# Patient Record
Sex: Female | Born: 1985 | Race: White | Hispanic: No | Marital: Single | Smoking: Never smoker
Health system: Southern US, Community
[De-identification: ages and names within clinical notes are randomized; demographics above are authoritative.]

## PROBLEM LIST (undated history)

## (undated) ENCOUNTER — Inpatient Hospital Stay (HOSPITAL_COMMUNITY): Payer: Self-pay

## (undated) DIAGNOSIS — G43909 Migraine, unspecified, not intractable, without status migrainosus: Secondary | ICD-10-CM

## (undated) DIAGNOSIS — N289 Disorder of kidney and ureter, unspecified: Secondary | ICD-10-CM

## (undated) DIAGNOSIS — K219 Gastro-esophageal reflux disease without esophagitis: Secondary | ICD-10-CM

## (undated) HISTORY — PX: OTHER SURGICAL HISTORY: SHX169

## (undated) HISTORY — PX: KIDNEY SURGERY: SHX687

## (undated) HISTORY — PX: ECTOPIC PREGNANCY SURGERY: SHX613

---
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
## 2006-05-30 ENCOUNTER — Ambulatory Visit: Payer: Self-pay | Admitting: Family Medicine

## 2006-06-09 ENCOUNTER — Ambulatory Visit: Payer: Self-pay | Admitting: Family Medicine

## 2006-08-14 ENCOUNTER — Other Ambulatory Visit: Admission: RE | Admit: 2006-08-14 | Discharge: 2006-08-14 | Payer: Self-pay | Admitting: Family Medicine

## 2006-08-14 ENCOUNTER — Ambulatory Visit: Payer: Self-pay | Admitting: Family Medicine

## 2007-01-16 ENCOUNTER — Ambulatory Visit: Payer: Self-pay | Admitting: Family Medicine

## 2007-02-27 ENCOUNTER — Ambulatory Visit: Payer: Self-pay | Admitting: Family Medicine

## 2007-04-15 ENCOUNTER — Emergency Department: Payer: Self-pay | Admitting: Emergency Medicine

## 2007-06-05 ENCOUNTER — Ambulatory Visit: Payer: Self-pay | Admitting: Family Medicine

## 2007-09-24 ENCOUNTER — Ambulatory Visit: Payer: Self-pay | Admitting: Family Medicine

## 2008-07-23 ENCOUNTER — Emergency Department: Payer: Self-pay | Admitting: Emergency Medicine

## 2008-08-06 ENCOUNTER — Other Ambulatory Visit: Admission: RE | Admit: 2008-08-06 | Discharge: 2008-08-06 | Payer: Self-pay | Admitting: Family Medicine

## 2008-08-06 ENCOUNTER — Ambulatory Visit: Payer: Self-pay | Admitting: Family Medicine

## 2009-02-11 ENCOUNTER — Emergency Department: Payer: Self-pay | Admitting: Unknown Physician Specialty

## 2009-05-08 ENCOUNTER — Ambulatory Visit: Payer: Self-pay | Admitting: Family Medicine

## 2009-06-22 ENCOUNTER — Ambulatory Visit: Payer: Self-pay | Admitting: Family Medicine

## 2009-08-14 ENCOUNTER — Ambulatory Visit: Payer: Self-pay | Admitting: Family Medicine

## 2009-08-25 ENCOUNTER — Ambulatory Visit: Payer: Self-pay | Admitting: Family Medicine

## 2009-08-26 ENCOUNTER — Encounter: Admission: RE | Admit: 2009-08-26 | Discharge: 2009-08-26 | Payer: Self-pay | Admitting: Family Medicine

## 2009-09-21 ENCOUNTER — Ambulatory Visit: Payer: Self-pay | Admitting: Family Medicine

## 2009-10-08 ENCOUNTER — Emergency Department (HOSPITAL_COMMUNITY): Admission: EM | Admit: 2009-10-08 | Discharge: 2009-10-09 | Payer: Self-pay | Admitting: Emergency Medicine

## 2009-10-16 ENCOUNTER — Ambulatory Visit (HOSPITAL_COMMUNITY): Admission: RE | Admit: 2009-10-16 | Discharge: 2009-10-16 | Payer: Self-pay | Admitting: Obstetrics and Gynecology

## 2009-11-16 ENCOUNTER — Emergency Department: Payer: Self-pay | Admitting: Emergency Medicine

## 2009-12-15 ENCOUNTER — Ambulatory Visit: Payer: Self-pay | Admitting: Family Medicine

## 2010-01-14 ENCOUNTER — Ambulatory Visit: Payer: Self-pay | Admitting: Family Medicine

## 2010-12-17 ENCOUNTER — Other Ambulatory Visit: Payer: Self-pay | Admitting: Obstetrics and Gynecology

## 2010-12-31 ENCOUNTER — Inpatient Hospital Stay (HOSPITAL_COMMUNITY)
Admission: AD | Admit: 2010-12-31 | Discharge: 2011-01-01 | DRG: 775 | Disposition: A | Discharge status: Home / Self Care | Payer: Medicaid Other | Source: Ambulatory Visit | Attending: Obstetrics and Gynecology | Admitting: Obstetrics and Gynecology

## 2010-12-31 LAB — RPR: RPR Ser Ql: NONREACTIVE

## 2010-12-31 LAB — CBC
HCT: 35.5 % — ABNORMAL LOW (ref 36.0–46.0)
Hemoglobin: 11.9 g/dL — ABNORMAL LOW (ref 12.0–15.0)
MCH: 29.5 pg (ref 26.0–34.0)
RBC: 4.04 MIL/uL (ref 3.87–5.11)

## 2011-01-01 LAB — CBC
HCT: 30.8 % — ABNORMAL LOW (ref 36.0–46.0)
Hemoglobin: 10 g/dL — ABNORMAL LOW (ref 12.0–15.0)
MCHC: 32.5 g/dL (ref 30.0–36.0)
WBC: 11.4 10*3/uL — ABNORMAL HIGH (ref 4.0–10.5)

## 2011-01-11 ENCOUNTER — Inpatient Hospital Stay (HOSPITAL_COMMUNITY): Admission: AD | Admit: 2011-01-11 | Payer: Self-pay | Admitting: Obstetrics and Gynecology

## 2011-01-27 ENCOUNTER — Other Ambulatory Visit: Payer: Self-pay | Admitting: Obstetrics and Gynecology

## 2011-03-02 LAB — POCT I-STAT, CHEM 8
BUN: 11 mg/dL (ref 6–23)
Calcium, Ion: 1.14 mmol/L (ref 1.12–1.32)
Creatinine, Ser: 0.6 mg/dL (ref 0.4–1.2)
Glucose, Bld: 109 mg/dL — ABNORMAL HIGH (ref 70–99)
TCO2: 24 mmol/L (ref 0–100)

## 2011-03-02 LAB — COMPREHENSIVE METABOLIC PANEL
ALT: 15 U/L (ref 0–35)
AST: 24 U/L (ref 0–37)
CO2: 24 mEq/L (ref 19–32)
Calcium: 8.9 mg/dL (ref 8.4–10.5)
Chloride: 105 mEq/L (ref 96–112)
GFR calc Af Amer: >60 mL/min (ref >60)
GFR calc non Af Amer: >60 mL/min (ref >60)
Sodium: 137 mEq/L (ref 135–145)

## 2011-03-02 LAB — CBC
Hemoglobin: 12.9 g/dL (ref 12.0–15.0)
MCHC: 34.4 g/dL (ref 30.0–36.0)
MCHC: 34.5 g/dL (ref 30.0–36.0)
MCV: 91.7 fL (ref 78.0–100.0)
RBC: 4.1 MIL/uL (ref 3.87–5.11)
RBC: 4.08 MIL/uL (ref 3.87–5.11)
WBC: 11.3 10*3/uL — ABNORMAL HIGH (ref 4.0–10.5)

## 2011-03-02 LAB — LIPASE, BLOOD: Lipase: 20 U/L (ref 11–59)

## 2011-03-02 LAB — WET PREP, GENITAL: WBC, Wet Prep HPF POC: NONE SEEN

## 2011-03-02 LAB — DIFFERENTIAL
Eosinophils Absolute: 0.1 10*3/uL (ref 0.0–0.7)
Eosinophils Relative: 1 % (ref 0–5)
Lymphs Abs: 2 10*3/uL (ref 0.7–4.0)

## 2011-03-02 LAB — URINALYSIS, ROUTINE W REFLEX MICROSCOPIC
Glucose, UA: NEGATIVE mg/dL
Hgb urine dipstick: NEGATIVE
Ketones, ur: NEGATIVE mg/dL
Protein, ur: NEGATIVE mg/dL

## 2011-03-02 LAB — GC/CHLAMYDIA PROBE AMP, GENITAL
Chlamydia, DNA Probe: NEGATIVE
GC Probe Amp, Genital: NEGATIVE

## 2011-04-15 NOTE — H&P (Signed)
Debbie Jackson, Debbie Jackson                        ACCOUNT NO.:  [id number]   MEDICAL RECORD NO.:  [id number]                   PATIENT TYPE:  INP   LOCATION:  0373                                 FACILITY:  [facility]   PHYSICIAN:  Jamison Neighbor, M.D.               DATE OF BIRTH:  Jun 19, 1986   DATE OF ADMISSION:  12/20/2003  DATE OF DISCHARGE:                                HISTORY & PHYSICAL   ADMITTING DIAGNOSES:  1. Distal right ureteral calculus.  2. Pregnancy.  3. 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   HISTORY:  This 25 year old female developed acute right-sided pain and  presented to the emergency room at [facility].  CT scan showed a  stone in distal right ureter, and also showed that the patient was pregnant.  At first the patient stated that would be impossible noting that she had had  a period in December, and then she did admit that she was sexually active  and might indeed be pregnant.  The patient has been brought to [facility]  for definitive removal of stone and will have a double-J placed if  appropriate but great care will be given to not use fluoroscopy if at all  possible.  Patient and her parents gave informed consent for the procedure.  She will be observed afterwards and most likely will be discharged later  today.   The patient's past medical history is remarkable for a right partial  nephrectomy with subsequent re-implantation of the ureter.  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  She has no other medical illnesses.  She does take multivitamins every day as well as cranberry pills.  The  patient does not use tobacco, alcohol or street drugs.  She has an otherwise  negative medical history.   FAMILY HISTORY:  Unchanged.   SOCIAL HISTORY:  Unchanged.   REVIEW OF SYSTEMS:  Unchanged.   PHYSICAL EXAMINATION:  VITAL SIGNS:  Temperature 98.1, pulse 73,  respirations 20, blood pressure 101/53.  GENERAL:  She is a well-developed, well-nourished female in no acute  distress.  HEENT:  Normocephalic, atraumatic.  Cranial nerves II-XII are grossly  intact.  NECK:  Supple.  No adenopathy or thyromegaly.  LUNGS:  Clear.  HEART:  Regular rate and rhythm.  No murmurs, thrills, gallops, rubs, or  heaves.  ABDOMEN:  Soft and nontender with no palpable masses, rebound and guarding.  EXTREMITIES:  No cyanosis, clubbing or edema.  NEUROLOGIC:  This appears grossly intact.   IMPRESSION:  Distal right ureteral calculus in patient with past history of  right ureteral duplication, right upper pole nephrectomy, and right re-  implantation.   SECONDARY DIAGNOSIS:  Pregnancy.   PLAN:  Ureteroscopy and stone removal.                                               Jamison Neighbor, M.D.    RJE/MEDQ  D:  12/20/2003  T:  12/20/2003  Job:  [id number]

## 2011-04-15 NOTE — Op Note (Signed)
NAMEMANUEL, Debbie Jackson                        ACCOUNT NO.:  [id number]   MEDICAL RECORD NO.:  [id number]                   PATIENT TYPE:  INP   LOCATION:  0373                                 FACILITY:  [facility]   PHYSICIAN:  Jamison Neighbor, M.D.               DATE OF BIRTH:  [birth year]   DATE OF PROCEDURE:  12/20/2003  DATE OF DISCHARGE:  12/20/2003                                 OPERATIVE REPORT   REASON FOR REPEAT DICTATION:  Lost dictation.   PREOPERATIVE DIAGNOSES:  1. Distal right ureteral calculus.  2. Pregnancy.  3. Past history of right partial nephrectomy with ureteral reimplantation.   PRINCIPAL PROCEDURE:  Cystoscopy, right ureteroscopy, right double J  catheter insertion.   SURGEON:  Jamison Neighbor, M.D.   ANESTHESIA:  General.   COMPLICATIONS:  None.   DRAINS:  A 6 French x 26 cm double J catheter.   HISTORY:  This 25 year old female presented to the emergency room with right-  sided pain.  A CT scan showed a right UVJ stent but also showed the patient  to be pregnant.  The patient needs to undergo stone extraction and double J  catheter insertion.  The patient's history is complicated by the fact that  she had a partial duplication and had what sounds like a right partial  nephrectomy and reimplantation.  The patient understands the risks and  benefits of the procedure.  Her parents were apprised of the condition and  gave informed consent.   DESCRIPTION OF PROCEDURE:  After successful induction of general anesthesia,  the patient was placed in the dorsal lithotomy position and prepped with  Betadine and draped in the usual sterile fashion.  Cystoscopy was performed.  The bladder was carefully inspected, and it was free of any tumor or stones.  The left ureteral orifice was normal in configuration and location.  The  right ureteral orifice showed evidence of reimplantation.  It appeared that  the ureter from the duplicated upper pole had been removed.  The  stone was  visualized and grasped and removed.  A double J catheter was then passed and  allowed to coil in the bladder.  This was left on a string.  No fluoroscopy  was used during the procedure.  The patient tolerated the procedure well and  was taken to the recovery room in good condition.  She was sent home with  Darvocet and Keflex and was given advice as to when to remove the  __________.  She will have routine follow-up in the office.                                               Jamison Neighbor, M.D.    RJE/MEDQ  D:  01/02/2004  T:  01/03/2004  Job:  [id number] 

## 2011-04-15 NOTE — Op Note (Signed)
[name], Debbie Jackson                        ACCOUNT NO.:  [id number]   MEDICAL RECORD NO.:  [id number]                   PATIENT TYPE:  INP   LOCATION:  0373                                 FACILITY:  [facility]   PHYSICIAN:  Jamison Neighbor, M.D.               DATE OF BIRTH:  04/10/1986   DATE OF PROCEDURE:  12/20/2003  DATE OF DISCHARGE:                                 OPERATIVE REPORT   SERVICE:  Urology.   PREOPERATIVE DIAGNOSES:  1. Distal right ureteral calculus.  2. 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   POSTOPERATIVE DIAGNOSES:  1. Distal right ureteral calculus.  2. 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   PROCEDURE:  Cystoscopy, right ureteroscopy, right double J catheter  insertion.   SURGEON:  Jamison Neighbor, M.D.   COMPLICATIONS:  None.   DRAINS:  6 French x 26 cm double J catheter.   HISTORY:  This 25 year old female developed acute pain in the right hand  side yesterday evening. She presented to the emergency room where CT imaging  demonstrated a stone in the distal right ureter. The patient was also  incidentally noted to have approximately a two month pregnancy. The patient  was transferred to [facility] for definitive therapy to remove the stone  and placement of the double J catheter.  The patient understands the risks  and benefits of the procedure. She is a minor, permission was obtained from  her parents. Full informed consent was obtained.  Both the patient and  family were advised this would be done without fluoroscopy if at all  possible.   DESCRIPTION OF PROCEDURE:  After successful induction of general anesthesia,  the patient was placed dorsal lithotomy position, prepped with Betadine and  draped in the usual sterile fashion.  Cystoscopy was performed. The bladder  was carefully inspected and it was free  of any tumor or stones. On the right  hand side, the ureteral orifice was identified fairly close to the bladder  neck where it was clear that she had been reimplanted. The ureter was not  stenotic, the stone was seen actually impacted at the opening.  On the left  hand side duplication was identified. A guidewire was passed beyond the  stone and up to the kidney, fluoroscopy was not utilized to check the  position. The ureteroscope was advanced along side the wire and stone  material was identified. The nitinol basket was used to grasp the stone  material. The stone broke into small pieces and was flushed out.  The stone  almost had the appearance of struvite material. The ureteroscope was  advanced its entire length up to the kidney and no other  abnormalities in the ureter were detected. The patient had no other stones  and the opening into the  pelvis appeared quite normal. The ureteroscope was  removed, a double J catheter was passed over the guidewire and was  positioned so that the coil was just inside the bladder. This was left on a  string and will be removed in several days.                                               Jamison Neighbor, M.D.    RJE/MEDQ  D:  12/20/2003  T:  12/20/2003  Job:  [id number]

## 2011-07-16 ENCOUNTER — Observation Stay: Payer: Self-pay | Admitting: Obstetrics & Gynecology

## 2011-07-20 LAB — PATHOLOGY REPORT

## 2012-09-29 ENCOUNTER — Emergency Department: Payer: Self-pay | Admitting: Emergency Medicine

## 2012-09-29 LAB — URINALYSIS, COMPLETE
Bacteria: NONE SEEN
Bilirubin,UR: NEGATIVE
Blood: NEGATIVE
Glucose,UR: NEGATIVE mg/dL
Ketone: NEGATIVE
Leukocyte Esterase: NEGATIVE
Nitrite: NEGATIVE
Ph: 6
Protein: NEGATIVE
RBC,UR: 1 /HPF
Specific Gravity: 1.021
Squamous Epithelial: NONE SEEN
WBC UR: 1 /HPF

## 2012-09-29 LAB — PREGNANCY, URINE: Pregnancy Test, Urine: NEGATIVE m[IU]/mL

## 2012-09-30 LAB — BETA STREP CULTURE(ARMC)

## 2012-12-27 ENCOUNTER — Emergency Department (HOSPITAL_COMMUNITY)
Admission: EM | Admit: 2012-12-27 | Discharge: 2012-12-28 | Disposition: A | Discharge status: Home / Self Care | Payer: Self-pay | Attending: Emergency Medicine | Admitting: Emergency Medicine

## 2012-12-27 ENCOUNTER — Encounter (HOSPITAL_COMMUNITY): Payer: Self-pay | Admitting: *Deleted

## 2012-12-27 DIAGNOSIS — Z3202 Encounter for pregnancy test, result negative: Secondary | ICD-10-CM | POA: Insufficient documentation

## 2012-12-27 DIAGNOSIS — Z87448 Personal history of other diseases of urinary system: Secondary | ICD-10-CM | POA: Insufficient documentation

## 2012-12-27 DIAGNOSIS — F17200 Nicotine dependence, unspecified, uncomplicated: Secondary | ICD-10-CM | POA: Insufficient documentation

## 2012-12-27 DIAGNOSIS — R110 Nausea: Secondary | ICD-10-CM | POA: Insufficient documentation

## 2012-12-27 DIAGNOSIS — Z87442 Personal history of urinary calculi: Secondary | ICD-10-CM | POA: Insufficient documentation

## 2012-12-27 DIAGNOSIS — Z9889 Other specified postprocedural states: Secondary | ICD-10-CM | POA: Insufficient documentation

## 2012-12-27 DIAGNOSIS — R3915 Urgency of urination: Secondary | ICD-10-CM | POA: Insufficient documentation

## 2012-12-27 DIAGNOSIS — N1330 Unspecified hydronephrosis: Secondary | ICD-10-CM | POA: Insufficient documentation

## 2012-12-27 HISTORY — DX: Disorder of kidney and ureter, unspecified: N28.9

## 2012-12-27 LAB — URINALYSIS, ROUTINE W REFLEX MICROSCOPIC
Bilirubin Urine: NEGATIVE
Glucose, UA: NEGATIVE mg/dL
Hgb urine dipstick: NEGATIVE
Ketones, ur: NEGATIVE mg/dL
Leukocytes, UA: NEGATIVE
Nitrite: NEGATIVE
Protein, ur: NEGATIVE mg/dL
Specific Gravity, Urine: 1.025 (ref 1.005–1.030)
Urobilinogen, UA: 0.2 mg/dL (ref 0.0–1.0)
pH: 5.5 (ref 5.0–8.0)

## 2012-12-27 NOTE — ED Notes (Signed)
Pt is here for evaluation of right flank pain which she has had intermittently for some time, it has gotten worse over the past 3 months.  Pt also reports intermittent right lower abdominal pain.  Pt reports pressure, frequency and some pain with urination

## 2012-12-27 NOTE — ED Provider Notes (Signed)
History     CSN: [id number]  Arrival date & time 12/27/12  1818   First MD Initiated Contact with Patient 12/27/12 2104      Chief Complaint  Patient presents with  . Flank Pain    (Consider location/radiation/quality/duration/timing/severity/associated sxs/prior treatment) The history is provided by the patient and medical records.    Debbie Jackson is a 27 y.o. 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 He states symptoms worsened approximately 3 months ago and then again this week. She states she's had intermittent pain every day this week. She describes the pain as sharp, located in the right flank, radiating to the right abdomen and rated at a 7/10. Patient states the pain is not present during this interview.  Associated symptoms include occasional nausea, urinary urgency.  Nothing makes it better and nothing makes it worse.  Pt denies fever, chills, headache, neck pain, chest pain, shortness of breath, vomiting, diarrhea, dysuria, frequency, hematuria, syncope.  Patient also states that 3 years ago she had surgery to correct a twisted ureter and urology noted that time but this would potentially recur. Patient states that her symptoms abated for approximately 2 years and then began again approximately one year ago.  Patient also a history of kidney stones however at her last check 6 months ago did not reveal any.   Past Medical History  Diagnosis Date  . Renal disorder     since childhood    Past Surgical History  Procedure Date  . Ectopic pregnancy surgery     right tube removed after burst tubal pregnancy    No family history on file.  History  Substance Use Topics  . Smoking status: Current Every Day Smoker    Types:  Cigarettes  . Smokeless tobacco: Not on file  . Alcohol Use: No    OB History    Grav Para Term Preterm Abortions TAB SAB Ect Mult Living                  Review of Systems  Constitutional: Negative for fever, diaphoresis, appetite change, fatigue and unexpected weight change.  HENT: Negative for mouth sores, trouble swallowing, neck pain and neck stiffness.   Eyes: Negative for visual disturbance.  Respiratory: Negative for cough, chest tightness, shortness of breath, wheezing and stridor.   Cardiovascular: Negative for chest pain and palpitations.  Gastrointestinal: Positive for nausea and abdominal pain. Negative for vomiting, diarrhea, constipation, blood in stool, abdominal distention and rectal pain.  Genitourinary: Positive for urgency and flank pain. Negative for dysuria, frequency, hematuria and difficulty urinating.  Musculoskeletal: Negative for back pain.  Skin: Negative for rash.  Neurological: Negative for syncope, weakness, light-headedness and headaches.  Hematological: Negative for adenopathy. Does not bruise/bleed easily.  Psychiatric/Behavioral: Negative for confusion and sleep disturbance. The patient is not nervous/anxious.   All other systems reviewed and are negative.    Allergies  Review of patient's allergies indicates no known allergies.  Home Medications   Current Outpatient Rx  Name  Route  Sig  Dispense  Refill  . ADULT MULTIVITAMIN W/MINERALS CH   Oral   Take 1 tablet by mouth daily.         Marland Kitchen HYDROCODONE-ACETAMINOPHEN 5-325  MG PO TABS   Oral   Take 2 tablets by mouth every 4 (four) hours as needed for pain.   7 tablet   0     BP 122/74  Pulse 78  Temp 97.1 F (36.2 C) (Oral)  Resp 18  SpO2 97%  Physical Exam  Nursing note and vitals reviewed. Constitutional: She is oriented to person, place, and time. She appears well-developed and well-nourished.  HENT:  Head: Normocephalic and atraumatic.  Right Ear: Tympanic membrane,  external ear and ear canal normal.  Left Ear: Tympanic membrane, external ear and ear canal normal.  Nose: Nose normal.  Mouth/Throat: Uvula is midline, oropharynx is clear and moist and mucous membranes are normal. No oropharyngeal exudate, posterior oropharyngeal edema, posterior oropharyngeal erythema or tonsillar abscesses.  Eyes: Conjunctivae normal and EOM are normal. Pupils are equal, round, and reactive to light. No scleral icterus.  Neck: Normal range of motion.  Cardiovascular: Normal rate, regular rhythm, normal heart sounds and intact distal pulses.  Exam reveals no gallop and no friction rub.   No murmur heard. Pulmonary/Chest: Effort normal and breath sounds normal. No respiratory distress. She has no wheezes. She has no rales. She exhibits no tenderness.  Abdominal: Soft. Bowel sounds are normal. She exhibits no distension and no mass. There is no hepatosplenomegaly. There is tenderness in the right upper quadrant and right lower quadrant. There is no rigidity, no rebound, no guarding, no CVA tenderness, no tenderness at McBurney's point and negative Murphy's sign.    Musculoskeletal: Normal range of motion. She exhibits no edema and no tenderness.  Lymphadenopathy:    She has no cervical adenopathy.  Neurological: She is alert and oriented to person, place, and time. She exhibits normal muscle tone. Coordination normal.  Skin: Skin is warm and dry. No rash noted. No erythema.  Psychiatric: She has a normal mood and affect.    ED Course  Procedures (including critical care time)  Labs Reviewed  URINALYSIS, ROUTINE W REFLEX MICROSCOPIC - Abnormal; Notable for the following:    APPearance CLOUDY (*)     All other components within normal limits  POCT PREGNANCY, URINE  POCT I-STAT, CHEM 8   US Renal  12/28/2012  *RADIOLOGY REPORT*  Clinical Data: 27 year old female with flank pain.  History of chronic right hydronephrosis.  RENAL/URINARY TRACT ULTRASOUND COMPLETE   Comparison:  02/03/2012 CT and 10/09/2009 abdominal ultrasound.  Findings:  Right Kidney:  The right kidney is normal in echogenicity and measures 9.9 cm.  Severe chronic hydronephrosis is unchanged from prior studies.  There is no evidence of mass or definite renal calculi.  Left Kidney:  The left kidney is normal in echogenicity and size measuring 11.1 cm.  There is no evidence of hydronephrosis, solid renal mass or definite renal calculi.  Bladder:  The bladder is normal for degree of filling.  Bilateral ureteral jets are noted.  IMPRESSION: No evidence of acute abnormality.  Unchanged severe chronic right hydronephrosis.   Original Report Authenticated By: Harmon Pier, M.D.      1. Hydronephrosis, left   2. H/O hydronephrosis       MDM  Debbie Jackson presents with right flank pain. Patient with a history of kidney stones and twisted ureter.  Urinalysis without evidence of urinary tract infection. No hemoglobin making kidney stone unlikely. Preg test negative. Patient without pelvic pain on exam. She denies vaginal discharge or vaginal bleeding. Do not feel that a pelvic exam as indicated at this time.  Will order a renal ultrasound to evaluate for hydronephrosis.     Renal ultrasound with severe chronic hydronephrosis of the right kidney unchanged from prior studies. Chem-8 without evidence of elevated BUN creatinine. We'll discharge home with further evaluation at [facility].  Patient is nontoxic, nonseptic appearing, in no apparent distress. Pain managed.  Labs, imaging and vitals reviewed.  Patient does not meet the SIRS or Sepsis criteria.  On repeat exam patient does not have a surgical abdomin and there are nor peritoneal signs.  No concern for appendicitis, bowel obstruction, bowel perforation, cholecystitis, diverticulitis, PID or ectopic pregnancy.  Patient discharged home with symptomatic treatment and given strict instructions for follow-up urology.  I have also discussed  reasons to return immediately to the ER.  Patient expresses understanding and agrees with plan.   1. Medications: usual home medications, vicodin for severe pain 2. Treatment: rest, drink plenty of fluids, 3. Follow Up: Please followup with your primary doctor for discussion of your diagnoses and further evaluation after today's visit; if you do not have a primary care doctor use the resource guide provided to find one; followup with [facility] and her nephrologist.       Dierdre Forth, PA-C 12/28/12 0203

## 2012-12-28 ENCOUNTER — Emergency Department (HOSPITAL_COMMUNITY): Payer: Self-pay

## 2012-12-28 LAB — POCT I-STAT, CHEM 8
BUN: 11 mg/dL (ref 6–23)
Calcium, Ion: 1.22 mmol/L (ref 1.12–1.23)
Chloride: 106 mEq/L (ref 96–112)
Creatinine, Ser: 0.8 mg/dL (ref 0.50–1.10)
Glucose, Bld: 96 mg/dL (ref 70–99)
HCT: 41 % (ref 36.0–46.0)
Hemoglobin: 13.9 g/dL (ref 12.0–15.0)
Potassium: 3.7 meq/L (ref 3.5–5.1)
Sodium: 143 meq/L (ref 135–145)
TCO2: 26 mmol/L (ref 0–100)

## 2012-12-28 MED ORDER — HYDROCODONE-ACETAMINOPHEN 5-325 MG PO TABS: 2.0000 | ORAL_TABLET | ORAL | Status: DC | PRN

## 2012-12-29 NOTE — ED Provider Notes (Signed)
Medical screening examination/treatment/procedure(s) were performed by non-physician practitioner and as supervising physician I was immediately available for consultation/collaboration.   Gavin Pound. Oletta Lamas, MD 12/29/12 2206

## 2013-02-07 ENCOUNTER — Emergency Department: Payer: Self-pay | Admitting: Emergency Medicine

## 2014-10-14 ENCOUNTER — Emergency Department: Payer: Self-pay | Admitting: Emergency Medicine

## 2014-10-14 LAB — CBC
HCT: 39 % (ref 35.0–47.0)
HGB: 13.5 g/dL (ref 12.0–16.0)
MCH: 31.2 pg (ref 26.0–34.0)
MCHC: 34.5 g/dL (ref 32.0–36.0)
MCV: 90 fL (ref 80–100)
PLATELETS: 191 10*3/uL (ref 150–440)
RBC: 4.32 10*6/uL (ref 3.80–5.20)
RDW: 12.3 % (ref 11.5–14.5)
WBC: 9.7 10*3/uL (ref 3.6–11.0)

## 2014-10-14 LAB — HCG, QUANTITATIVE, PREGNANCY: BETA HCG, QUANT.: 3217 m[IU]/mL — AB

## 2014-11-28 NOTE — L&D Delivery Note (Signed)
Patient was C/C/+1 and pushed for 10 minutes with epidural.   NSVD  Female infant, Apgars 9/9, weight pending.   The patient had no laceration.  Of note, umbilical cord evulsed and manual extraction of placenta- cord appears was circumvallate - only attached to membranes and no attachment site on placenta Fundus was firm. EBL was expected amount. Placenta was delivered intact. Vagina was clear.  Baby was vigorous and doing skin to skin with mother.  Philip AspenALLAHAN, Thalia Turkington

## 2014-12-10 LAB — OB RESULTS CONSOLE RUBELLA ANTIBODY, IGM: Rubella: IMMUNE

## 2014-12-10 LAB — OB RESULTS CONSOLE HIV ANTIBODY (ROUTINE TESTING): HIV: NONREACTIVE

## 2014-12-10 LAB — OB RESULTS CONSOLE ABO/RH: RH TYPE: POSITIVE

## 2014-12-10 LAB — OB RESULTS CONSOLE ANTIBODY SCREEN: Antibody Screen: NEGATIVE

## 2014-12-10 LAB — OB RESULTS CONSOLE RPR: RPR: NONREACTIVE

## 2014-12-10 LAB — OB RESULTS CONSOLE GC/CHLAMYDIA
Chlamydia: NEGATIVE
GC PROBE AMP, GENITAL: NEGATIVE

## 2014-12-10 LAB — OB RESULTS CONSOLE HEPATITIS B SURFACE ANTIGEN: HEP B S AG: NEGATIVE

## 2015-03-27 ENCOUNTER — Encounter (HOSPITAL_COMMUNITY): Payer: Self-pay | Admitting: *Deleted

## 2015-03-27 ENCOUNTER — Inpatient Hospital Stay (HOSPITAL_COMMUNITY)
Admission: AD | Admit: 2015-03-27 | Discharge: 2015-03-27 | Disposition: A | Discharge status: Home / Self Care | Payer: Medicaid Other | Source: Ambulatory Visit | Attending: Obstetrics | Admitting: Obstetrics

## 2015-03-27 DIAGNOSIS — O4702 False labor before 37 completed weeks of gestation, second trimester: Secondary | ICD-10-CM

## 2015-03-27 DIAGNOSIS — O368120 Decreased fetal movements, second trimester, not applicable or unspecified: Secondary | ICD-10-CM | POA: Diagnosis not present

## 2015-03-27 DIAGNOSIS — Z3A27 27 weeks gestation of pregnancy: Secondary | ICD-10-CM | POA: Insufficient documentation

## 2015-03-27 DIAGNOSIS — Z87891 Personal history of nicotine dependence: Secondary | ICD-10-CM | POA: Diagnosis not present

## 2015-03-27 DIAGNOSIS — O4700 False labor before 37 completed weeks of gestation, unspecified trimester: Secondary | ICD-10-CM | POA: Insufficient documentation

## 2015-03-27 LAB — URINALYSIS, ROUTINE W REFLEX MICROSCOPIC
Bilirubin Urine: NEGATIVE
GLUCOSE, UA: NEGATIVE mg/dL
Hgb urine dipstick: NEGATIVE
KETONES UR: NEGATIVE mg/dL
NITRITE: NEGATIVE
Protein, ur: NEGATIVE mg/dL
Specific Gravity, Urine: 1.01 (ref 1.005–1.030)
UROBILINOGEN UA: 0.2 mg/dL (ref 0.0–1.0)
pH: 7 (ref 5.0–8.0)

## 2015-03-27 LAB — URINE MICROSCOPIC-ADD ON

## 2015-03-27 NOTE — MAU Note (Signed)
C/o decreased fetal movement since yesterday; c/o intermittent vaginal pressure for the past week and today the pressure is more constant; having some abdominal cramping today;

## 2015-03-27 NOTE — Discharge Instructions (Signed)

## 2015-03-27 NOTE — MAU Note (Signed)
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

## 2015-03-27 NOTE — MAU Provider Note (Signed)
History     CSN: [id number]  Arrival date and time: 03/27/15 1123   First Provider Initiated Contact with Patient 03/27/15 1211      Chief Complaint  Patient presents with  . Decreased Fetal Movement  . Vaginal Pain   HPI Debbie Jackson 29 y.o. [id number] @[redacted]w[redacted]d  presents to MAU complaining of increased pressure x several weeks but more so today and decreased fetal movement.  She noticed less fetal movement beginning yesterday but that has improved since being here.  She has felt a continuous pressure since theis morning whereas before it would come and go.  She denies VB but does report some leaking of fluid on and off for a couple of days.  She denies nausea, vomiting, fever, weakness.     OB History    Gravida Para Term Preterm AB TAB SAB Ectopic Multiple Living   6 2 2  3  3   2       Past Medical History  Diagnosis Date  . Renal disorder     since childhood    Past Surgical History  Procedure Laterality Date  . Ectopic pregnancy surgery      right tube removed after burst tubal pregnancy  . Kidney surgery      Family History  Problem Relation Age of Onset  . Alcohol abuse Neg Hx   . Arthritis Neg Hx   . Asthma Neg Hx   . Birth defects Neg Hx   . Cancer Neg Hx   . COPD Neg Hx   . Depression Neg Hx   . Diabetes Neg Hx   . Drug abuse Neg Hx   . Early death Neg Hx   . Hearing loss Neg Hx   . Heart disease Neg Hx   . Hyperlipidemia Neg Hx   . Hypertension Neg Hx   . Kidney disease Neg Hx   . Learning disabilities Neg Hx   . Mental illness Neg Hx   . Mental retardation Neg Hx   . Miscarriages / Stillbirths Neg Hx   . Stroke Neg Hx   . Vision loss Neg Hx   . Varicose Veins Neg Hx     History  Substance Use Topics  . Smoking status: Former Smoker    Types: Cigarettes  . Smokeless tobacco: Not on file  . Alcohol Use: No    Allergies: No Known Allergies  Prescriptions prior to admission  Medication Sig Dispense Refill Last Dose  . Prenatal Vit-Fe  Fumarate-FA (PRENATAL MULTIVITAMIN) TABS tablet Take 1 tablet by mouth daily at 12 noon.   03/26/2015 at Unknown time  . HYDROcodone-acetaminophen (NORCO/VICODIN) 5-325 MG per tablet Take 2 tablets by mouth every 4 (four) hours as needed for pain. (Patient not taking: Reported on 03/27/2015) 7 tablet 0 Not Taking at Unknown time    ROS Pertinent ROS in HPI.  All other systems are negative.   Physical Exam   Blood pressure 110/69, pulse 96, temperature 98.4 F (36.9 C), temperature source Oral, resp. rate 16.  Physical Exam  Constitutional: She is oriented to person, place, and time. She appears well-developed and well-nourished. No distress.  HENT:  Head: Normocephalic and atraumatic.  Eyes: EOM are normal.  Neck: Normal range of motion.  Cardiovascular: Normal rate, regular rhythm and normal heart sounds.   Respiratory: Effort normal and breath sounds normal. No respiratory distress.  GI: Soft. She exhibits no distension. There is no tenderness. There is no rebound and no guarding.  Genitourinary:  FFN collected  Speculum inserted.  No pooling noted.  Pt asked to cough forcefully and repeatedly with no leaking noted whatsoever.   Cervical check reveals closed cervix.   No need for FFN  Musculoskeletal: Normal range of motion.  Neurological: She is alert and oriented to person, place, and time.  Skin: Skin is warm and dry.  Psychiatric: She has a normal mood and affect.   Fetal Tracing: Baseline: 130 Variability: mod Accelerations: 10x10 Decelerations:none Toco:none   MAU Course  Procedures  MDM Discussed with Dr. Chestine Spore.  Pt has noted increased fetal movement while in MAU.  Fetal strip is reassuring for [redacted] week gestation.  PTL ruled out.  Dr. Chestine Spore agreeable to discharge to home with follow up next week  Assessment and Plan  A:  1. Decreased fetal movement, second trimester, not applicable or unspecified fetus   2. Threatened preterm labor, second trimester    P:  Discharge to home Fetal kick counts PTL precautions reviewed OB urine culture pending Keep OB appt for 5/3 Patient may return to MAU as needed or if her condition were to change or worsen   Bertram Denver 03/27/2015, 12:12 PM

## 2015-03-28 LAB — CULTURE, OB URINE: Colony Count: 30000

## 2015-04-30 ENCOUNTER — Inpatient Hospital Stay (HOSPITAL_COMMUNITY)
Admission: AD | Admit: 2015-04-30 | Discharge: 2015-04-30 | Disposition: A | Discharge status: Home / Self Care | Payer: Medicaid Other | Source: Ambulatory Visit | Attending: Obstetrics and Gynecology | Admitting: Obstetrics and Gynecology

## 2015-04-30 ENCOUNTER — Encounter (HOSPITAL_COMMUNITY): Payer: Self-pay | Admitting: *Deleted

## 2015-04-30 DIAGNOSIS — R102 Pelvic and perineal pain: Secondary | ICD-10-CM | POA: Diagnosis present

## 2015-04-30 DIAGNOSIS — O4703 False labor before 37 completed weeks of gestation, third trimester: Secondary | ICD-10-CM

## 2015-04-30 DIAGNOSIS — O2343 Unspecified infection of urinary tract in pregnancy, third trimester: Secondary | ICD-10-CM | POA: Insufficient documentation

## 2015-04-30 DIAGNOSIS — Z3A32 32 weeks gestation of pregnancy: Secondary | ICD-10-CM | POA: Insufficient documentation

## 2015-04-30 LAB — WET PREP, GENITAL
CLUE CELLS WET PREP: NONE SEEN
TRICH WET PREP: NONE SEEN
Yeast Wet Prep HPF POC: NONE SEEN

## 2015-04-30 LAB — URINE MICROSCOPIC-ADD ON

## 2015-04-30 LAB — URINALYSIS, ROUTINE W REFLEX MICROSCOPIC
BILIRUBIN URINE: NEGATIVE
Glucose, UA: NEGATIVE mg/dL
HGB URINE DIPSTICK: NEGATIVE
KETONES UR: NEGATIVE mg/dL
Nitrite: POSITIVE — AB
PH: 6 (ref 5.0–8.0)
Protein, ur: NEGATIVE mg/dL
Specific Gravity, Urine: 1.025 (ref 1.005–1.030)
Urobilinogen, UA: 0.2 mg/dL (ref 0.0–1.0)

## 2015-04-30 LAB — FETAL FIBRONECTIN: FETAL FIBRONECTIN: NEGATIVE

## 2015-04-30 MED ORDER — NITROFURANTOIN MONOHYD MACRO 100 MG PO CAPS: 100.0000 mg | ORAL_CAPSULE | Freq: Two times a day (BID) | ORAL | Status: DC

## 2015-04-30 NOTE — MAU Note (Signed)
Intermittent pelvis pressure, only had 2 contractions today Denies bright red vaginal bleeding.   Positive fetal movement Denies SROM/LOF  Denies any infections/complications of pregnancy

## 2015-04-30 NOTE — MAU Note (Signed)
Started yesterday, feeling pressure. Would come and go, felt like needed to push, not related to using restroom, to urinate or have bm. Has continued today, also had a few contractions, 30 min apart and then they quit

## 2015-04-30 NOTE — MAU Provider Note (Signed)
Chief Complaint:  Pelvic Pain  First Provider Initiated Contact with Patient 04/30/15 1827     HPI: [name] is a 29 y.o. [id number] at 6778w5d who presents to maternity admissions reporting pelvic pressure and feeling of needing to bear down. No problems with preterm labor this pregnancy. No history of preterm birth. Reports 0-2 contractions per hour, slightly more than usual over the last few days. Denies leakage of fluid or vaginal bleeding. Good fetal movement. Last intercourse greater than 24 hours ago.  Past Medical History: Past Medical History  Diagnosis Date  . Renal disorder     since childhood    Past obstetric history: OB History  Gravida Para Term Preterm AB SAB TAB Ectopic Multiple Living  6 2 2  3 2  0 1  2    # Outcome Date GA Lbr Len/2nd Weight Sex Delivery Anes PTL Lv  6 Current           5 SAB           4 Ectopic           3 Term           2 Term           1 SAB               Past Surgical History: Past Surgical History  Procedure Laterality Date  . Ectopic pregnancy surgery      right tube removed after burst tubal pregnancy  . Kidney surgery       Family History: Family History  Problem Relation Age of Onset  . Alcohol abuse Neg Hx   . Arthritis Neg Hx   . Asthma Neg Hx   . Birth defects Neg Hx   . Cancer Neg Hx   . COPD Neg Hx   . Depression Neg Hx   . Diabetes Neg Hx   . Drug abuse Neg Hx   . Early death Neg Hx   . Hearing loss Neg Hx   . Heart disease Neg Hx   . Hyperlipidemia Neg Hx   . Hypertension Neg Hx   . Kidney disease Neg Hx   . Learning disabilities Neg Hx   . Mental illness Neg Hx   . Mental retardation Neg Hx   . Miscarriages / Stillbirths Neg Hx   . Stroke Neg Hx   . Vision loss Neg Hx   . Varicose Veins Neg Hx     Social History: History  Substance Use Topics  . Smoking status: Never Smoker   . Smokeless tobacco: Never Used  . Alcohol Use: No    Allergies: No Known Allergies  Meds:  Prescriptions prior to  admission  Medication Sig Dispense Refill Last Dose  . Prenatal Vit-Fe Fumarate-FA (PRENATAL MULTIVITAMIN) TABS tablet Take 1 tablet by mouth daily at 12 noon.   03/26/2015 at Unknown time    ROS:  Review of Systems  Constitutional: Negative for fever and chills.  Gastrointestinal: Negative for nausea, vomiting, abdominal pain, diarrhea and constipation.  Genitourinary: Positive for frequency. Negative for dysuria, urgency, hematuria and flank pain.       Negative for vaginal bleeding, vaginal discharge or leaking of fluid.    Physical Exam  Blood pressure 120/66, pulse 80, temperature 98.3 F (36.8 C), temperature source Oral, resp. rate 16, height 5' 4.5" (1.638 m), weight 156 lb (70.761 kg). GENERAL: Well-developed, well-nourished female in no acute distress.  HEART: normal rate RESP: normal effort GI: Abd soft,  non-tender, gravid appropriate for gestational age.  MS: Extremities nontender, no edema, normal ROM NEURO: Alert and oriented x 4.  GU: NEFG, physiologic discharge, no blood. No CVAT Dilation: Fingertip Effacement (%): Thick Station: Ballotable Exam by:: Ivonne Andrew , CNM   FHT:  Baseline 145 , moderate variability, 15x15 accelerations present, no decelerations Contractions: Rare, mild   Labs: Results for orders placed or performed during the hospital encounter of 04/30/15 (from the past 24 hour(s))  Urinalysis, Routine w reflex microscopic (not at [facility])     Status: Abnormal   Collection Time: 04/30/15  5:20 PM  Result Value Ref Range   Color, Urine YELLOW YELLOW   APPearance HAZY (A) CLEAR   Specific Gravity, Urine 1.025 1.005 - 1.030   pH 6.0 5.0 - 8.0   Glucose, UA NEGATIVE NEGATIVE mg/dL   Hgb urine dipstick NEGATIVE NEGATIVE   Bilirubin Urine NEGATIVE NEGATIVE   Ketones, ur NEGATIVE NEGATIVE mg/dL   Protein, ur NEGATIVE NEGATIVE mg/dL   Urobilinogen, UA 0.2 0.0 - 1.0 mg/dL   Nitrite POSITIVE (A) NEGATIVE   Leukocytes, UA LARGE (A) NEGATIVE  Urine  microscopic-add on     Status: Abnormal   Collection Time: 04/30/15  5:20 PM  Result Value Ref Range   Squamous Epithelial / LPF FEW (A) RARE   WBC, UA 21-50 <3 WBC/hpf   Bacteria, UA MANY (A) RARE  Wet prep, genital     Status: Abnormal   Collection Time: 04/30/15  6:24 PM  Result Value Ref Range   Yeast Wet Prep HPF POC NONE SEEN NONE SEEN   Trich, Wet Prep NONE SEEN NONE SEEN   Clue Cells Wet Prep HPF POC NONE SEEN NONE SEEN   WBC, Wet Prep HPF POC FEW (A) NONE SEEN  Fetal fibronectin     Status: None   Collection Time: 04/30/15  6:24 PM  Result Value Ref Range   Fetal Fibronectin NEGATIVE NEGATIVE    Imaging:  No results found.  MAU Course: UA, pelvic exam, EFM/toco, wet prep, fetal fibronectin, push fluids.  Assessment: 1. UTI in pregnancy, third trimester   2. Preterm contractions, third trimester     Plan: Discharge home in stable condition.  Preterm labor precautions and fetal kick counts. Urine culture pending. Increase fluids and rest     Follow-up Information    Follow up with Levi Aland, MD.   Specialty:  Obstetrics and Gynecology   Why:  Routine prenatal visit or sooner as needed if symptoms worsen   Contact information:   [address] [place] [address] [phone]       Follow up with [facility] [place] [facility].   Why:  As needed in emergencies   Contact information:   [address] [id number] mc [place] [address] [phone]        Medication List    TAKE these medications        nitrofurantoin (macrocrystal-monohydrate) 100 MG capsule  Commonly known as:  MACROBID  Take 1 capsule (100 mg total) by mouth 2 (two) times daily.     prenatal multivitamin Tabs tablet  Take 1 tablet by mouth daily at 12 noon.        [place], [place] 04/30/2015 7:15 PM

## 2015-04-30 NOTE — Discharge Instructions (Signed)
Pregnancy and Urinary Tract Infection °A urinary tract infection (UTI) is a bacterial infection of the urinary tract. Infection of the urinary tract can include the ureters, kidneys (pyelonephritis), bladder (cystitis), and urethra (urethritis). All pregnant women should be screened for bacteria in the urinary tract. Identifying and treating a UTI will decrease the risk of preterm labor and developing more serious infections in both the mother and baby. °CAUSES °Bacteria germs cause almost all UTIs.  °RISK FACTORS °Many factors can increase your chances of getting a UTI during pregnancy. These include: °· Having a short urethra. °· Poor toilet and hygiene habits. °· Sexual intercourse. °· Blockage of urine along the urinary tract. °· Problems with the pelvic muscles or nerves. °· Diabetes. °· Obesity. °· Bladder problems after having several children. °· Previous history of UTI. °SIGNS AND SYMPTOMS  °· Pain, burning, or a stinging feeling when urinating. °· Suddenly feeling the need to urinate right away (urgency). °· Loss of bladder control (urinary incontinence). °· Frequent urination, more than is common with pregnancy. °· Lower abdominal or back discomfort. °· Cloudy urine. °· Blood in the urine (hematuria). °· Fever.  °When the kidneys are infected, the symptoms may be: °· Back pain. °· Flank pain on the right side more so than the left. °· Fever. °· Chills. °· Nausea. °· Vomiting. °DIAGNOSIS  °A urinary tract infection is usually diagnosed through urine tests. Additional tests and procedures are sometimes done. These may include: °· Ultrasound exam of the kidneys, ureters, bladder, and urethra. °· Looking in the bladder with a lighted tube (cystoscopy). °TREATMENT °Typically, UTIs can be treated with antibiotic medicines.  °HOME CARE INSTRUCTIONS  °· Only take over-the-counter or prescription medicines as directed by your health care provider. If you were prescribed antibiotics, take them as directed. Finish  them even if you start to feel better. °· Drink enough fluids to keep your urine clear or pale yellow. °· Do not have sexual intercourse until the infection is gone and your health care provider says it is okay. °· Make sure you are tested for UTIs throughout your pregnancy. These infections often come back.  °Preventing a UTI in the Future °· Practice good toilet habits. Always wipe from front to back. Use the tissue only once. °· Do not hold your urine. Empty your bladder as soon as possible when the urge comes. °· Do not douche or use deodorant sprays. °· Wash with soap and warm water around the genital area and the anus. °· Empty your bladder before and after sexual intercourse. °· Wear underwear with a cotton crotch. °· Avoid caffeine and carbonated drinks. They can irritate the bladder. °· Drink cranberry juice or take cranberry pills. This may decrease the risk of getting a UTI. °· Do not drink alcohol. °· Keep all your appointments and tests as scheduled.  °SEEK MEDICAL CARE IF:  °· Your symptoms get worse. °· You are still having fevers 2 or more days after treatment begins. °· You have a rash. °· You feel that you are having problems with medicines prescribed. °· You have abnormal vaginal discharge. °SEEK IMMEDIATE MEDICAL CARE IF:  °· You have back or flank pain. °· You have chills. °· You have blood in your urine. °· You have nausea and vomiting. °· You have contractions of your uterus. °· You have a gush of fluid from the vagina. °MAKE SURE YOU: °· Understand these instructions.   °· Will watch your condition.   °· Will get help right away if you are not doing   well or get worse.  Document Released: 03/11/2011 Document Revised: 09/04/2013 Document Reviewed: 06/13/2013 ExitCare Patient Information 2015 [employer]. This information is not intended to replace advice given to you by your health care provider. Make sure you discuss any questions you have with your health care provider.  Preterm  Labor Information Preterm labor is when labor starts at less than 37 weeks of pregnancy. The normal length of a pregnancy is 39 to 41 weeks. CAUSES Often, there is no identifiable underlying cause as to why a woman goes into preterm labor. One of the most common known causes of preterm labor is infection. Infections of the uterus, cervix, vagina, amniotic sac, bladder, kidney, or even the lungs (pneumonia) can cause labor to start. Other suspected causes of preterm labor include:   Urogenital infections, such as yeast infections and bacterial vaginosis.   Uterine abnormalities (uterine shape, uterine septum, fibroids, or bleeding from the placenta).   A cervix that has been operated on (it may fail to stay closed).   Malformations in the fetus.   Multiple gestations (twins, triplets, and so on).   Breakage of the amniotic sac.  RISK FACTORS  Having a previous history of preterm labor.   Having premature rupture of membranes (PROM).   Having a placenta that covers the opening of the cervix (placenta previa).   Having a placenta that separates from the uterus (placental abruption).   Having a cervix that is too weak to hold the fetus in the uterus (incompetent cervix).   Having too much fluid in the amniotic sac (polyhydramnios).   Taking illegal drugs or smoking while pregnant.   Not gaining enough weight while pregnant.   Being younger than 18 and older than 28 years old.   Having a low socioeconomic status.   Being African American. SYMPTOMS Signs and symptoms of preterm labor include:   Menstrual-like cramps, abdominal pain, or back pain.  Uterine contractions that are regular, as frequent as six in an hour, regardless of their intensity (may be mild or painful).  Contractions that start on the top of the uterus and spread down to the lower abdomen and back.   A sense of increased pelvic pressure.   A watery or bloody mucus discharge that comes  from the vagina.  TREATMENT Depending on the length of the pregnancy and other circumstances, your health care provider may suggest bed rest. If necessary, there are medicines that can be given to stop contractions and to mature the fetal lungs. If labor happens before 34 weeks of pregnancy, a prolonged hospital stay may be recommended. Treatment depends on the condition of both you and the fetus.  WHAT SHOULD YOU DO IF YOU THINK YOU ARE IN PRETERM LABOR? Call your health care provider right away. You will need to go to the hospital to get checked immediately. HOW CAN YOU PREVENT PRETERM LABOR IN FUTURE PREGNANCIES? You should:   Stop smoking if you smoke.  Maintain healthy weight gain and avoid chemicals and drugs that are not necessary.  Be watchful for any type of infection.  Inform your health care provider if you have a known history of preterm labor. Document Released: 02/04/2004 Document Revised: 07/17/2013 Document Reviewed: 12/17/2012 ExitCare Patient Information 2015 [employer]. This information is not intended to replace advice given to you by your health care provider. Make sure you discuss any questions you have with your health care provider.  

## 2015-05-02 LAB — CULTURE, OB URINE
Colony Count: >=100000
SPECIAL REQUESTS: NORMAL

## 2015-05-05 ENCOUNTER — Encounter: Payer: Self-pay | Admitting: Advanced Practice Midwife

## 2015-05-05 DIAGNOSIS — R8271 Bacteriuria: Secondary | ICD-10-CM | POA: Insufficient documentation

## 2015-05-07 ENCOUNTER — Other Ambulatory Visit: Payer: Self-pay | Admitting: Medical

## 2015-05-07 DIAGNOSIS — B951 Streptococcus, group B, as the cause of diseases classified elsewhere: Secondary | ICD-10-CM

## 2015-05-07 DIAGNOSIS — O2343 Unspecified infection of urinary tract in pregnancy, third trimester: Principal | ICD-10-CM

## 2015-05-07 MED ORDER — AMOXICILLIN 875 MG PO TABS
875.0000 mg | ORAL_TABLET | Freq: Two times a day (BID) | ORAL | Status: DC
Start: 2015-05-07 — End: 2015-06-03

## 2015-05-07 NOTE — Progress Notes (Signed)
Called patient to inform her of need for change in antibiotics due to +GBS in urine. Patient voiced understanding.   Marny Lowenstein, PA-C 05/07/2015 2:49 PM

## 2015-06-03 ENCOUNTER — Encounter (HOSPITAL_COMMUNITY): Payer: Self-pay | Admitting: *Deleted

## 2015-06-03 ENCOUNTER — Inpatient Hospital Stay (HOSPITAL_COMMUNITY): Payer: Medicaid Other

## 2015-06-03 ENCOUNTER — Inpatient Hospital Stay (HOSPITAL_COMMUNITY)
Admission: AD | Admit: 2015-06-03 | Discharge: 2015-06-03 | Disposition: A | Discharge status: Home / Self Care | Payer: Medicaid Other | Source: Ambulatory Visit | Attending: Obstetrics | Admitting: Obstetrics

## 2015-06-03 DIAGNOSIS — B951 Streptococcus, group B, as the cause of diseases classified elsewhere: Secondary | ICD-10-CM | POA: Diagnosis not present

## 2015-06-03 DIAGNOSIS — Z3A37 37 weeks gestation of pregnancy: Secondary | ICD-10-CM | POA: Insufficient documentation

## 2015-06-03 DIAGNOSIS — O368130 Decreased fetal movements, third trimester, not applicable or unspecified: Secondary | ICD-10-CM | POA: Diagnosis present

## 2015-06-03 DIAGNOSIS — N390 Urinary tract infection, site not specified: Secondary | ICD-10-CM | POA: Diagnosis not present

## 2015-06-03 DIAGNOSIS — R8271 Bacteriuria: Secondary | ICD-10-CM

## 2015-06-03 DIAGNOSIS — O368190 Decreased fetal movements, unspecified trimester, not applicable or unspecified: Secondary | ICD-10-CM

## 2015-06-03 NOTE — MAU Provider Note (Signed)
History     CSN: [id number]  Arrival date and time: 06/03/15 1728   First Provider Initiated Contact with Patient 06/03/15 1806      Chief Complaint  Patient presents with  . Decreased Fetal Movement   HPI Pt is 3954w4d pregnant sent from office with decreased FM and non reactive tracing. Pt denies vaginal bleeding, LOF; has irregular ctx  denies UTI sx Note 06/03/2015 5:41 PM    Expand All Collapse All   Sent from office, decreased fetal movement, non-reactive tracing. No bleeding or leaking. irreg contractions. Was 3 cm in office.         Past Medical History  Diagnosis Date  . Renal disorder     since childhood    Past Surgical History  Procedure Laterality Date  . Ectopic pregnancy surgery      right tube removed after burst tubal pregnancy  . Kidney surgery      Family History  Problem Relation Age of Onset  . Alcohol abuse Neg Hx   . Arthritis Neg Hx   . Asthma Neg Hx   . Birth defects Neg Hx   . Cancer Neg Hx   . COPD Neg Hx   . Depression Neg Hx   . Diabetes Neg Hx   . Drug abuse Neg Hx   . Early death Neg Hx   . Hearing loss Neg Hx   . Heart disease Neg Hx   . Hyperlipidemia Neg Hx   . Hypertension Neg Hx   . Kidney disease Neg Hx   . Learning disabilities Neg Hx   . Mental illness Neg Hx   . Mental retardation Neg Hx   . Miscarriages / Stillbirths Neg Hx   . Stroke Neg Hx   . Vision loss Neg Hx   . Varicose Veins Neg Hx     History  Substance Use Topics  . Smoking status: Never Smoker   . Smokeless tobacco: Never Used  . Alcohol Use: No    Allergies: No Known Allergies  Prescriptions prior to admission  Medication Sig Dispense Refill Last Dose  . amoxicillin (AMOXIL) 875 MG tablet Take 1 tablet (875 mg total) by mouth 2 (two) times daily. 14 tablet 0   . Prenatal Vit-Fe Fumarate-FA (PRENATAL MULTIVITAMIN) TABS tablet Take 1 tablet by mouth daily at 12 noon.   03/26/2015 at Unknown time    Review of Systems  Constitutional:  Negative for fever and chills.  Gastrointestinal: Positive for abdominal pain. Negative for nausea, vomiting, diarrhea and constipation.  Genitourinary: Negative for dysuria and urgency.   Physical Exam   Blood pressure 125/77, pulse 79, temperature 98.3 F (36.8 C), temperature source Oral, resp. rate 18.  Physical Exam  Nursing note and vitals reviewed. Constitutional: She is oriented to person, place, and time. She appears well-developed and well-nourished.  HENT:  Head: Normocephalic.  Eyes: Conjunctivae are normal. Pupils are equal, round, and reactive to light.  Neck: Normal range of motion. Neck supple.  Cardiovascular: Normal rate.   Respiratory: Effort normal.  GI: Soft. She exhibits no distension. There is no tenderness. There is no rebound and no guarding.  Mild irreg ctx; reactive NST  Musculoskeletal: She exhibits edema.  Neurological: She is alert and oriented to person, place, and time.  Skin: Skin is warm and dry.  Psychiatric: She has a normal mood and affect.    MAU Course  Procedures BPP 8/8 preliminary results RN reported to Dr. Chestine Sporelark Assessment and Plan  Decreased FM  Reactive NST and BPP 8/8 F/u with Ob appointment  Oralee Rapaport 06/03/2015, 6:06 PM

## 2015-06-03 NOTE — MAU Note (Signed)
Sent from office, decreased fetal movement, non-reactive tracing.  No bleeding or leaking.  irreg contractions.  Was 3 cm in office.

## 2015-06-11 ENCOUNTER — Encounter (HOSPITAL_COMMUNITY): Payer: Self-pay | Admitting: Anesthesiology

## 2015-06-11 ENCOUNTER — Inpatient Hospital Stay (HOSPITAL_COMMUNITY)
Admission: AD | Admit: 2015-06-11 | Discharge: 2015-06-13 | DRG: 767 | Disposition: A | Discharge status: Home / Self Care | Payer: Medicaid Other | Source: Ambulatory Visit | Attending: Obstetrics and Gynecology | Admitting: Obstetrics and Gynecology

## 2015-06-11 ENCOUNTER — Encounter (HOSPITAL_COMMUNITY): Payer: Self-pay | Admitting: *Deleted

## 2015-06-11 DIAGNOSIS — O99824 Streptococcus B carrier state complicating childbirth: Principal | ICD-10-CM | POA: Diagnosis present

## 2015-06-11 DIAGNOSIS — N2889 Other specified disorders of kidney and ureter: Secondary | ICD-10-CM | POA: Diagnosis present

## 2015-06-11 DIAGNOSIS — O26833 Pregnancy related renal disease, third trimester: Secondary | ICD-10-CM | POA: Diagnosis present

## 2015-06-11 DIAGNOSIS — Z3A38 38 weeks gestation of pregnancy: Secondary | ICD-10-CM | POA: Diagnosis present

## 2015-06-11 DIAGNOSIS — R8271 Bacteriuria: Secondary | ICD-10-CM

## 2015-06-11 LAB — CBC
HCT: 34.7 % — ABNORMAL LOW (ref 36.0–46.0)
HEMOGLOBIN: 11.5 g/dL — AB (ref 12.0–15.0)
MCH: 29 pg (ref 26.0–34.0)
MCHC: 33.1 g/dL (ref 30.0–36.0)
MCV: 87.4 fL (ref 78.0–100.0)
Platelets: 171 10*3/uL (ref 150–400)
RBC: 3.97 MIL/uL (ref 3.87–5.11)
RDW: 14.9 % (ref 11.5–15.5)
WBC: 9.5 10*3/uL (ref 4.0–10.5)

## 2015-06-11 LAB — RPR: RPR Ser Ql: NONREACTIVE

## 2015-06-11 LAB — ABO/RH: ABO/RH(D): O POS

## 2015-06-11 LAB — TYPE AND SCREEN
ABO/RH(D): O POS
Antibody Screen: NEGATIVE

## 2015-06-11 MED ORDER — ZOLPIDEM TARTRATE 5 MG PO TABS: 5.0000 mg | ORAL_TABLET | Freq: Every evening | ORAL | Status: DC | PRN

## 2015-06-11 MED ORDER — BENZOCAINE-MENTHOL 20-0.5 % EX AERO
1.0000 "application " | INHALATION_SPRAY | CUTANEOUS | Status: DC | PRN
  Administered 2015-06-11: 1 via TOPICAL
  Filled 2015-06-11: qty 56

## 2015-06-11 MED ORDER — WITCH HAZEL-GLYCERIN EX PADS
1.0000 "application " | MEDICATED_PAD | CUTANEOUS | Status: DC | PRN
  Administered 2015-06-11: 1 via TOPICAL

## 2015-06-11 MED ORDER — SODIUM CHLORIDE 0.9 % IV SOLN
2.0000 g | Freq: Once | INTRAVENOUS | Status: AC
  Administered 2015-06-11: 2 g via INTRAVENOUS
  Filled 2015-06-11: qty 2000

## 2015-06-11 MED ORDER — OXYTOCIN BOLUS FROM INFUSION
500.0000 mL | INTRAVENOUS | Status: DC
  Administered 2015-06-11: 500 mL via INTRAVENOUS

## 2015-06-11 MED ORDER — FLEET ENEMA 7-19 GM/118ML RE ENEM: 1.0000 | ENEMA | RECTAL | Status: DC | PRN

## 2015-06-11 MED ORDER — CITRIC ACID-SODIUM CITRATE 334-500 MG/5ML PO SOLN: 30.0000 mL | ORAL | Status: DC | PRN

## 2015-06-11 MED ORDER — TETANUS-DIPHTH-ACELL PERTUSSIS 5-2.5-18.5 LF-MCG/0.5 IM SUSP: 0.5000 mL | Freq: Once | INTRAMUSCULAR | Status: DC

## 2015-06-11 MED ORDER — LIDOCAINE HCL (PF) 1 % IJ SOLN
INTRAMUSCULAR | Status: AC
  Filled 2015-06-11: qty 30

## 2015-06-11 MED ORDER — ONDANSETRON HCL 4 MG PO TABS: 4.0000 mg | ORAL_TABLET | ORAL | Status: DC | PRN

## 2015-06-11 MED ORDER — OXYCODONE-ACETAMINOPHEN 5-325 MG PO TABS: 2.0000 | ORAL_TABLET | ORAL | Status: DC | PRN

## 2015-06-11 MED ORDER — OXYTOCIN 40 UNITS IN LACTATED RINGERS INFUSION - SIMPLE MED
INTRAVENOUS | Status: AC
  Filled 2015-06-11: qty 1000

## 2015-06-11 MED ORDER — IBUPROFEN 600 MG PO TABS
600.0000 mg | ORAL_TABLET | Freq: Four times a day (QID) | ORAL | Status: DC
  Administered 2015-06-11 – 2015-06-13 (×8): 600 mg via ORAL
  Filled 2015-06-11 (×8): qty 1

## 2015-06-11 MED ORDER — LACTATED RINGERS IV SOLN
INTRAVENOUS | Status: DC
  Administered 2015-06-11: 07:00:00 via INTRAVENOUS

## 2015-06-11 MED ORDER — DIPHENHYDRAMINE HCL 25 MG PO CAPS: 25.0000 mg | ORAL_CAPSULE | Freq: Four times a day (QID) | ORAL | Status: DC | PRN

## 2015-06-11 MED ORDER — OXYCODONE-ACETAMINOPHEN 5-325 MG PO TABS: 1.0000 | ORAL_TABLET | ORAL | Status: DC | PRN

## 2015-06-11 MED ORDER — OXYTOCIN 40 UNITS IN LACTATED RINGERS INFUSION - SIMPLE MED: 62.5000 mL/h | INTRAVENOUS | Status: DC

## 2015-06-11 MED ORDER — SIMETHICONE 80 MG PO CHEW: 80.0000 mg | CHEWABLE_TABLET | ORAL | Status: DC | PRN

## 2015-06-11 MED ORDER — ACETAMINOPHEN 325 MG PO TABS: 650.0000 mg | ORAL_TABLET | ORAL | Status: DC | PRN

## 2015-06-11 MED ORDER — PRENATAL MULTIVITAMIN CH
1.0000 | ORAL_TABLET | Freq: Every day | ORAL | Status: DC
  Administered 2015-06-11 – 2015-06-12 (×2): 1 via ORAL
  Filled 2015-06-11 (×2): qty 1

## 2015-06-11 MED ORDER — LACTATED RINGERS IV SOLN: 500.0000 mL | INTRAVENOUS | Status: DC | PRN

## 2015-06-11 MED ORDER — SENNOSIDES-DOCUSATE SODIUM 8.6-50 MG PO TABS
2.0000 | ORAL_TABLET | ORAL | Status: DC
  Administered 2015-06-11 – 2015-06-12 (×2): 2 via ORAL
  Filled 2015-06-11 (×2): qty 2

## 2015-06-11 MED ORDER — DIBUCAINE 1 % RE OINT: 1.0000 "application " | TOPICAL_OINTMENT | RECTAL | Status: DC | PRN

## 2015-06-11 MED ORDER — LIDOCAINE HCL (PF) 1 % IJ SOLN
30.0000 mL | INTRAMUSCULAR | Status: DC | PRN
  Filled 2015-06-11: qty 30

## 2015-06-11 MED ORDER — OXYCODONE-ACETAMINOPHEN 5-325 MG PO TABS
2.0000 | ORAL_TABLET | ORAL | Status: DC | PRN
Start: 2015-06-11 — End: 2015-06-11

## 2015-06-11 MED ORDER — ONDANSETRON HCL 4 MG/2ML IJ SOLN: 4.0000 mg | Freq: Four times a day (QID) | INTRAMUSCULAR | Status: DC | PRN

## 2015-06-11 MED ORDER — ONDANSETRON HCL 4 MG/2ML IJ SOLN: 4.0000 mg | INTRAMUSCULAR | Status: DC | PRN

## 2015-06-11 MED ORDER — LANOLIN HYDROUS EX OINT: TOPICAL_OINTMENT | CUTANEOUS | Status: DC | PRN

## 2015-06-11 NOTE — Progress Notes (Signed)
UR chart review completed.  

## 2015-06-11 NOTE — H&P (Signed)
29 y.o. 7686w5d  [id number] comes in c/o labor.  Otherwise has good fetal movement and no bleeding.  Past Medical History  Diagnosis Date  . Renal disorder     since childhood    Past Surgical History  Procedure Laterality Date  . Ectopic pregnancy surgery      right tube removed after burst tubal pregnancy  . Kidney surgery      OB History  Gravida Para Term Preterm AB SAB TAB Ectopic Multiple Living  6 2 2  3 2  0 1  2    # Outcome Date GA Lbr Len/2nd Weight Sex Delivery Anes PTL Lv  6 Current           5 SAB           4 Ectopic           3 Term           2 Term           1 SAB               History   Social History  . Marital Status: Single    Spouse Name: N/A  . Number of Children: N/A  . Years of Education: N/A   Occupational History  . Not on file.   Social History Main Topics  . Smoking status: Never Smoker   . Smokeless tobacco: Never Used  . Alcohol Use: No  . Drug Use: No  . Sexual Activity: Yes    Birth Control/ Protection: None   Other Topics Concern  . Not on file   Social History Narrative   Review of patient's allergies indicates no known allergies.    Prenatal Transfer Tool  Maternal Diabetes: No Genetic Screening: Normal Maternal Ultrasounds/Referrals: Normal Fetal Ultrasounds or other Referrals:  None Maternal Substance Abuse:  No Significant Maternal Medications:  None Significant Maternal Lab Results: Lab values include: Group B Strep positive  Other PNC: uncomplicated.    Filed Vitals:   06/11/15 0734  BP: 115/65  Pulse: 74  Temp:   Resp: 18     Lungs/Cor:  NAD Abdomen:  soft, gravid Ex:  no cords, erythema SVE:  5cm in mau FHTs:  130, good STV, NST R Toco:  2-3   A/P   Admitted to L&D  Quick progression to complete  Was able to administer 1 dose of ampicillin prior to delivery  GBS Pos  Routine care  [place], Luther ParodySIDNEY

## 2015-06-11 NOTE — Lactation Note (Signed)
This note was copied from the chart of Debbie [name]. Lactation Consultation Note; Mom reports that baby had already fed twice, nursed well with no pain. States it is going much better that she thought it would. With her last baby she fed formula while she was in the hospital but when she got home and her milk came in she tried breast feeding. Baby would not latch- it did not go well. Reviewed feeding cues and encouraged to feed whenever she sees them. Bf brochure given with resources for support after DC. No questions at present. To call for assist prn  Patient Name: Debbie [name] Today's Date: 06/11/2015 Reason for consult: Initial assessment   Maternal Data Formula Feeding for Exclusion: Yes Reason for exclusion: Mother's choice to formula and breast feed on admission Does the patient have breastfeeding experience prior to this delivery?: No (attempted with last baby once she got home and her milk came in, baby would not latch well)  Feeding    LATCH Score/Interventions                      Lactation Tools Discussed/Used     Consult Status Consult Status: Follow-up Date: 06/12/15 Follow-up type: In-patient    Pamelia HoitWeeks, Quintez Maselli D 06/11/2015, 1:30 PM

## 2015-06-12 LAB — CBC
HCT: 32.1 % — ABNORMAL LOW (ref 36.0–46.0)
Hemoglobin: 10.5 g/dL — ABNORMAL LOW (ref 12.0–15.0)
MCH: 29.2 pg (ref 26.0–34.0)
MCHC: 32.7 g/dL (ref 30.0–36.0)
MCV: 89.4 fL (ref 78.0–100.0)
Platelets: 131 10*3/uL — ABNORMAL LOW (ref 150–400)
RBC: 3.59 MIL/uL — ABNORMAL LOW (ref 3.87–5.11)
RDW: 15.4 % (ref 11.5–15.5)
WBC: 11.3 10*3/uL — ABNORMAL HIGH (ref 4.0–10.5)

## 2015-06-12 NOTE — Progress Notes (Signed)
Post Partum Day 1 Subjective: no complaints, up ad lib, voiding, tolerating PO, + flatus and breast feeding  Objective: Blood pressure 113/72, pulse 61, temperature 98.4 F (36.9 C), temperature source Oral, resp. rate 18, height 5' 4.5" (1.638 m), weight 74.39 kg (164 lb), SpO2 98 %, unknown if currently breastfeeding.  Physical Exam:  General: alert, cooperative and no distress Lochia: appropriate Uterine Fundus: firm DVT Evaluation: No evidence of DVT seen on physical exam. Negative Homan's sign. No cords or calf tenderness.   Recent Labs  06/11/15 0620 06/12/15 0540  HGB 11.5* 10.5*  HCT 34.7* 32.1*    Assessment/Plan: Plan for discharge tomorrow, Breastfeeding and Contraception will discuss at post partum visit   LOS: 1 day   Quenesha Douglass STACIA 06/12/2015, 9:01 AM

## 2015-06-13 MED ORDER — IBUPROFEN 600 MG PO TABS: 600.0000 mg | ORAL_TABLET | Freq: Four times a day (QID) | ORAL | Status: DC | PRN

## 2015-06-13 MED ORDER — OXYCODONE-ACETAMINOPHEN 5-325 MG PO TABS: 1.0000 | ORAL_TABLET | ORAL | Status: DC | PRN

## 2015-06-13 NOTE — Lactation Note (Signed)
This note was copied from the chart of Debbie Nonnie DoneRebecca Rhude. Lactation Consultation Note  Follow up made.  Noted mom giving all formula bottles since yesterday afternoon.  I asked mom if she has changed to formula feeding and she stated she has.  Offered assist and asked if she felt she had enough assist and support and she verbalizes she did.  Mom is confident with her decision.  Reviewed breast care as milk comes to volume.  Patient Name: Debbie Jackson     Maternal Data    Feeding Feeding Type: Bottle Fed - Formula  LATCH Score/Interventions                      Lactation Tools Discussed/Used     Consult Status      Huston FoleyMOULDEN, Lyann Hagstrom S Jackson, 8:59 AM

## 2015-06-13 NOTE — Discharge Summary (Signed)
Obstetric Discharge Summary Reason for Admission: onset of labor Prenatal Procedures: none Intrapartum Procedures: spontaneous vaginal delivery Postpartum Procedures: none Complications-Operative and Postpartum: none HEMOGLOBIN  Date Value Ref Range Status  06/12/2015 10.5* 12.0 - 15.0 g/dL Final   HGB  Date Value Ref Range Status  10/14/2014 13.5 12.0-16.0 g/dL Final   HCT  Date Value Ref Range Status  06/12/2015 32.1* 36.0 - 46.0 % Final  10/14/2014 39.0 35.0-47.0 % Final    Physical Exam:  General: alert, cooperative and no distress Lochia: appropriate Uterine Fundus: firm perineum: healing well, no significant drainage, no dehiscence DVT Evaluation: No evidence of DVT seen on physical exam. Negative Homan's sign. No cords or calf tenderness.  Discharge Diagnoses: Term Pregnancy-delivered  Discharge Information: Date: 06/13/2015 Activity: pelvic rest Diet: routine Medications: PNV, Ibuprofen and Percocet Condition: stable Instructions: refer to practice specific booklet Discharge to: home   Newborn Data: Live born female  Birth Weight: 7 lb 12.3 oz (3525 g) APGAR: 9, 9  Home with mother.  Debbie HartINN, Debbie Jackson Debbie Jackson 06/13/2015, 9:23 AM

## 2015-07-17 ENCOUNTER — Other Ambulatory Visit: Payer: Self-pay

## 2015-07-20 LAB — CYTOLOGY - PAP

## 2016-01-12 DIAGNOSIS — Q6211 Congenital occlusion of ureteropelvic junction: Secondary | ICD-10-CM | POA: Insufficient documentation

## 2016-01-12 DIAGNOSIS — Q6210 Congenital occlusion of ureter, unspecified: Secondary | ICD-10-CM | POA: Insufficient documentation

## 2016-03-17 ENCOUNTER — Emergency Department: Payer: Medicaid Other

## 2016-03-17 ENCOUNTER — Encounter: Payer: Self-pay | Admitting: Emergency Medicine

## 2016-03-17 ENCOUNTER — Emergency Department
Admission: EM | Admit: 2016-03-17 | Discharge: 2016-03-17 | Disposition: A | Discharge status: Home / Self Care | Payer: Medicaid Other | Attending: Emergency Medicine | Admitting: Emergency Medicine

## 2016-03-17 DIAGNOSIS — R109 Unspecified abdominal pain: Secondary | ICD-10-CM

## 2016-03-17 DIAGNOSIS — Z791 Long term (current) use of non-steroidal anti-inflammatories (NSAID): Secondary | ICD-10-CM | POA: Insufficient documentation

## 2016-03-17 DIAGNOSIS — R1011 Right upper quadrant pain: Secondary | ICD-10-CM | POA: Diagnosis present

## 2016-03-17 DIAGNOSIS — N390 Urinary tract infection, site not specified: Secondary | ICD-10-CM | POA: Diagnosis not present

## 2016-03-17 LAB — COMPREHENSIVE METABOLIC PANEL
ALBUMIN: 4.3 g/dL (ref 3.5–5.0)
ALT: 25 U/L (ref 14–54)
AST: 20 U/L (ref 15–41)
Alkaline Phosphatase: 65 U/L (ref 38–126)
Anion gap: 6 (ref 5–15)
BUN: 7 mg/dL (ref 6–20)
CHLORIDE: 109 mmol/L (ref 101–111)
CO2: 25 mmol/L (ref 22–32)
CREATININE: 0.73 mg/dL (ref 0.44–1.00)
Calcium: 8.9 mg/dL (ref 8.9–10.3)
GFR calc Af Amer: >60 mL/min (ref >60)
GFR calc non Af Amer: >60 mL/min (ref >60)
GLUCOSE: 97 mg/dL (ref 65–99)
POTASSIUM: 4 mmol/L (ref 3.5–5.1)
Sodium: 140 mmol/L (ref 135–145)
Total Bilirubin: 0.7 mg/dL (ref 0.3–1.2)
Total Protein: 7.2 g/dL (ref 6.5–8.1)

## 2016-03-17 LAB — URINALYSIS COMPLETE WITH MICROSCOPIC (ARMC ONLY)
Bilirubin Urine: NEGATIVE
GLUCOSE, UA: NEGATIVE mg/dL
Hgb urine dipstick: NEGATIVE
Ketones, ur: NEGATIVE mg/dL
Nitrite: NEGATIVE
PH: 5 (ref 5.0–8.0)
PROTEIN: NEGATIVE mg/dL
SPECIFIC GRAVITY, URINE: 1.014 (ref 1.005–1.030)

## 2016-03-17 LAB — CBC
HEMATOCRIT: 40.3 % (ref 35.0–47.0)
Hemoglobin: 13.9 g/dL (ref 12.0–16.0)
MCH: 30.1 pg (ref 26.0–34.0)
MCHC: 34.5 g/dL (ref 32.0–36.0)
MCV: 87.3 fL (ref 80.0–100.0)
PLATELETS: 204 10*3/uL (ref 150–440)
RBC: 4.62 MIL/uL (ref 3.80–5.20)
RDW: 12.7 % (ref 11.5–14.5)
WBC: 7.4 10*3/uL (ref 3.6–11.0)

## 2016-03-17 LAB — LIPASE, BLOOD: LIPASE: 21 U/L (ref 11–51)

## 2016-03-17 LAB — PREGNANCY, URINE: PREG TEST UR: NEGATIVE

## 2016-03-17 MED ORDER — ONDANSETRON 4 MG PO TBDP: 4.0000 mg | ORAL_TABLET | Freq: Four times a day (QID) | ORAL | Status: DC | PRN

## 2016-03-17 MED ORDER — CEPHALEXIN 500 MG PO CAPS: 500.0000 mg | ORAL_CAPSULE | Freq: Four times a day (QID) | ORAL | Status: DC

## 2016-03-17 MED ORDER — CEPHALEXIN 500 MG PO CAPS
500.0000 mg | ORAL_CAPSULE | Freq: Once | ORAL | Status: AC
  Administered 2016-03-17: 500 mg via ORAL
  Filled 2016-03-17: qty 1

## 2016-03-17 NOTE — Discharge Instructions (Signed)
You have been seen in the Emergency Department (ED) today for pain when urinating.  Your workup today suggests that you have a urinary tract infection (UTI).  Please take your antibiotic as prescribed and over-the-counter pain medication (Tylenol or Motrin) as needed, but no more than recommended on the label instructions.  Drink PLENTY of fluids.  Call your regular doctor to schedule the next available appointment to follow up on todays ED visit, or return immediately to the ED if your pain worsens, you have decreased urine production, develop fever, persistent vomiting, or other symptoms that concern you.    Urinary Tract Infection Urinary tract infections (UTIs) can develop anywhere along your urinary tract. Your urinary tract is your body's drainage system for removing wastes and extra water. Your urinary tract includes two kidneys, two ureters, a bladder, and a urethra. Your kidneys are a pair of bean-shaped organs. Each kidney is about the size of your fist. They are located below your ribs, one on each side of your spine. CAUSES Infections are caused by microbes, which are microscopic organisms, including fungi, viruses, and bacteria. These organisms are so small that they can only be seen through a microscope. Bacteria are the microbes that most commonly cause UTIs. SYMPTOMS  Symptoms of UTIs may vary by age and gender of the patient and by the location of the infection. Symptoms in young women typically include a frequent and intense urge to urinate and a painful, burning feeling in the bladder or urethra during urination. Older women and men are more likely to be tired, shaky, and weak and have muscle aches and abdominal pain. A fever may mean the infection is in your kidneys. Other symptoms of a kidney infection include pain in your back or sides below the ribs, nausea, and vomiting. DIAGNOSIS To diagnose a UTI, your caregiver will ask you about your symptoms. Your caregiver will also ask  you to provide a urine sample. The urine sample will be tested for bacteria and white blood cells. White blood cells are made by your body to help fight infection. TREATMENT  Typically, UTIs can be treated with medication. Because most UTIs are caused by a bacterial infection, they usually can be treated with the use of antibiotics. The choice of antibiotic and length of treatment depend on your symptoms and the type of bacteria causing your infection. HOME CARE INSTRUCTIONS  If you were prescribed antibiotics, take them exactly as your caregiver instructs you. Finish the medication even if you feel better after you have only taken some of the medication.  Drink enough water and fluids to keep your urine clear or pale yellow.  Avoid caffeine, tea, and carbonated beverages. They tend to irritate your bladder.  Empty your bladder often. Avoid holding urine for long periods of time.  Empty your bladder before and after sexual intercourse.  After a bowel movement, women should cleanse from front to back. Use each tissue only once. SEEK MEDICAL CARE IF:   You have back pain.  You develop a fever.  Your symptoms do not begin to resolve within 3 days. SEEK IMMEDIATE MEDICAL CARE IF:   You have severe back pain or lower abdominal pain.  You develop chills.  You have nausea or vomiting.  You have continued burning or discomfort with urination. MAKE SURE YOU:   Understand these instructions.  Will watch your condition.  Will get help right away if you are not doing well or get worse.   This information is not intended  to replace advice given to you by your health care provider. Make sure you discuss any questions you have with your health care provider.   Document Released: 08/24/2005 Document Revised: 08/05/2015 Document Reviewed: 12/23/2011 Elsevier Interactive Patient Education 2016 Elsevier Inc.   Abdominal Pain, Adult Many things can cause abdominal pain. Usually, abdominal  pain is not caused by a disease and will improve without treatment. It can often be observed and treated at home. Your health care provider will do a physical exam and possibly order blood tests and X-rays to help determine the seriousness of your pain. However, in many cases, more time must pass before a clear cause of the pain can be found. Before that point, your health care provider may not know if you need more testing or further treatment. HOME CARE INSTRUCTIONS Monitor your abdominal pain for any changes. The following actions may help to alleviate any discomfort you are experiencing:  Only take over-the-counter or prescription medicines as directed by your health care provider.  Do not take laxatives unless directed to do so by your health care provider.  Try a clear liquid diet (broth, tea, or water) as directed by your health care provider. Slowly move to a bland diet as tolerated. SEEK MEDICAL CARE IF:  You have unexplained abdominal pain.  You have abdominal pain associated with nausea or diarrhea.  You have pain when you urinate or have a bowel movement.  You experience abdominal pain that wakes you in the night.  You have abdominal pain that is worsened or improved by eating food.  You have abdominal pain that is worsened with eating fatty foods.  You have a fever. SEEK IMMEDIATE MEDICAL CARE IF:  Your pain does not go away within 2 hours.  You keep throwing up (vomiting).  Your pain is felt only in portions of the abdomen, such as the right side or the left lower portion of the abdomen.  You pass bloody or black tarry stools. MAKE SURE YOU:  Understand these instructions.  Will watch your condition.  Will get help right away if you are not doing well or get worse.   This information is not intended to replace advice given to you by your health care provider. Make sure you discuss any questions you have with your health care provider.   Document Released:  08/24/2005 Document Revised: 08/05/2015 Document Reviewed: 07/24/2013 Elsevier Interactive Patient Education [employer] Elsevier Inc.

## 2016-03-17 NOTE — ED Notes (Signed)
Pt presents with RUQ pain, n/v/d x 2 weeks. Pt has seen PCP but has not been able to have ultrasound due to medicaid red tape. Pt alert & oriented with NAD. Pain intermittent.

## 2016-03-17 NOTE — ED Provider Notes (Signed)
[facility] Emergency Department Provider Note  ____________________________________________  Time seen: Approximately 11:02 AM  I have reviewed the triage vital signs and the nursing notes.   HISTORY  Chief Complaint Abdominal Pain    HPI Debbie Jackson is a 30 y.o. female history of a duplicated renal system, previous pregnancy.  The patient reports that she has been experiencing intermittent pain in the last 10-15 minutes in the right upper abdomen associated with nausea and occasional vomiting off and on for about the last 2 weeks. Last experienced last night and caused her to vomit twice. She is presently not experiencing any pain or vomiting. She's felt well this morning. She saw her doctor and was advised to have an ultrasound possibly of her gallbladder, but since she was unable to get this arranged due to insurance concerns and has presented to the ER to have it done sooner.  No fevers or chills. No pain in her back or trouble with urination. She does have a history of kidney stones but reports this pain does not seem similar.  Denies pregnancy today. No pain in the lower abdomen or "pelvis".   Past Medical History  Diagnosis Date  . Renal disorder     since childhood  . Lupus (systemic lupus erythematosus) (HCC)     Patient Active Problem List   Diagnosis Date Noted  . Normal labor 06/11/2015  . GBS bacteriuria 05/05/2015    Past Surgical History  Procedure Laterality Date  . Ectopic pregnancy surgery      right tube removed after burst tubal pregnancy  . Kidney surgery      Current Outpatient Rx  Name  Route  Sig  Dispense  Refill  . cephALEXin (KEFLEX) 500 MG capsule   Oral   Take 1 capsule (500 mg total) by mouth 4 (four) times daily.   40 capsule   0   . ibuprofen (ADVIL,MOTRIN) 600 MG tablet   Oral   Take 1 tablet (600 mg total) by mouth every 6 (six) hours as needed.   60 tablet   3   . ondansetron (ZOFRAN ODT) 4 MG  disintegrating tablet   Oral   Take 1 tablet (4 mg total) by mouth every 6 (six) hours as needed for nausea or vomiting.   20 tablet   0   . oxyCODONE-acetaminophen (PERCOCET/ROXICET) 5-325 MG per tablet   Oral   Take 1-2 tablets by mouth every 4 (four) hours as needed for severe pain (for pain scale 4-7).   30 tablet   0   . Prenatal Vit-Fe Fumarate-FA (PRENATAL MULTIVITAMIN) TABS tablet   Oral   Take 1 tablet by mouth daily at 12 noon.           Allergies Review of patient's allergies indicates no known allergies.  Family History  Problem Relation Age of Onset  . Alcohol abuse Neg Hx   . Arthritis Neg Hx   . Asthma Neg Hx   . Birth defects Neg Hx   . Cancer Neg Hx   . COPD Neg Hx   . Depression Neg Hx   . Diabetes Neg Hx   . Drug abuse Neg Hx   . Early death Neg Hx   . Hearing loss Neg Hx   . Heart disease Neg Hx   . Hyperlipidemia Neg Hx   . Kidney disease Neg Hx   . Learning disabilities Neg Hx   . Mental illness Neg Hx   . Mental retardation Neg  Hx   . Miscarriages / Stillbirths Neg Hx   . Stroke Neg Hx   . Vision loss Neg Hx   . Varicose Veins Neg Hx   . Hypertension Mother   . Hypertension Father     Social History Social History  Substance Use Topics  . Smoking status: Never Smoker   . Smokeless tobacco: Never Used  . Alcohol Use: No    Review of Systems Constitutional: No fever/chills Eyes: No visual changes. ENT: No sore throat. Cardiovascular: Denies chest pain. Respiratory: Denies shortness of breath. Gastrointestinal:   No constipation. Genitourinary: Negative for dysuria. Musculoskeletal: Negative for back pain. Skin: Negative for rash. Neurological: Negative for headaches, focal weakness or numbness.  Patient reports she had a stool culture and blood work done at her primary care doctor's office and was told everything came back normal.  10-point ROS otherwise negative.  ____________________________________________   PHYSICAL  EXAM:  VITAL SIGNS: ED Triage Vitals  Enc Vitals Group     BP 03/17/16 1041 115/61 mmHg     Pulse Rate 03/17/16 1041 88     Resp 03/17/16 1041 18     Temp 03/17/16 1041 98.2 F (36.8 C)     Temp Source 03/17/16 1041 Oral     SpO2 03/17/16 1041 98 %     Weight 03/17/16 1041 150 lb (68.04 kg)     Height 03/17/16 1041 5\' 5"  (1.651 m)     Head Cir --      Peak Flow --      Pain Score 03/17/16 1042 4     Pain Loc --      Pain Edu? --      Excl. in GC? --    Constitutional: Alert and oriented. Well appearing and in no acute distress. Eyes: Conjunctivae are normal. PERRL. EOMI. Head: Atraumatic. Nose: No congestion/rhinnorhea. Mouth/Throat: Mucous membranes are moist.  Oropharynx non-erythematous. Neck: No stridor.   Cardiovascular: Normal rate, regular rhythm. Grossly normal heart sounds.  Good peripheral circulation. Respiratory: Normal respiratory effort.  No retractions. Lungs CTAB. Gastrointestinal: Soft and nontender except for a very slight discomfort under the right rib cage to palpation. No distention. No abdominal bruits. No CVA tenderness. No rebound or guarding. No peritonitis. No pain at McBurney's point. Some very slight discomfort with Murphy sign. Musculoskeletal: No lower extremity tenderness nor edema.  No joint effusions. Neurologic:  Normal speech and language. No gross focal neurologic deficits are appreciated. No gait instability. Skin:  Skin is warm, dry and intact. No rash noted. Psychiatric: Mood and affect are normal. Speech and behavior are normal.  ____________________________________________   LABS (all labs ordered are listed, but only abnormal results are displayed)  Labs Reviewed  URINALYSIS COMPLETEWITH MICROSCOPIC ([facility] ONLY) - Abnormal; Notable for the following:    Color, Urine YELLOW (*)    APPearance CLOUDY (*)    Leukocytes, UA 3+ (*)    Bacteria, UA FEW (*)    Squamous Epithelial / LPF 6-30 (*)    All other components within normal  limits  URINE CULTURE  CBC  COMPREHENSIVE METABOLIC PANEL  LIPASE, BLOOD  PREGNANCY, URINE   ____________________________________________  EKG  ED ECG REPORT I, Birdie Fetty, the attending physician, personally viewed and interpreted this ECG.  Date: 03/17/2016 EKG Time: 11:15 Rate: 70 Rhythm: normal sinus rhythm QRS Axis: normal Intervals: normal ST/T Wave abnormalities: normal Conduction Disturbances: none Narrative Interpretation: unremarkable, nonspecific T-wave abnormality noted in V3 probable normal variant  ____________________________________________  RADIOLOGY  US Renal (Final result) Result time: 03/17/16 12:29:39   Final result by Rad Results In Interface (03/17/16 12:29:39)   Narrative:   CLINICAL DATA: Right upper quadrant pain for 2 weeks. Diarrhea for 2 months. Vomiting for 2 weeks. Subsequent encounter.  EXAM: RENAL / URINARY TRACT ULTRASOUND COMPLETE  COMPARISON: CT abdomen and pelvis 01/05/2016 and 02/03/2012.  FINDINGS: Right Kidney:  Length: 10.9 cm. Chronic right hydronephrosis does not appear notably changed. Cause for hydronephrosis is not identified. No focal renal lesion is seen. Echogenicity is unremarkable.  Left Kidney:  Length: 13.0 cm. The patient has a duplicated left renal collecting system. No hydronephrosis or focal lesion. Echogenicity is unremarkable.  Bladder:  Appears normal for degree of bladder distention.  IMPRESSION: No change in chronic right hydronephrosis. No acute abnormality.  Duplicated left renal collecting system.   Electronically Signed By: Drusilla Kanner M.D. On: 03/17/2016 12:29          US Abdomen Limited RUQ (Final result) Result time: 03/17/16 12:27:39   Final result by Rad Results In Interface (03/17/16 12:27:39)   Narrative:   CLINICAL DATA: Right upper quadrant and abdominal pain for two weeks  EXAM: US ABDOMEN LIMITED - RIGHT UPPER QUADRANT  COMPARISON:  None.  FINDINGS: Gallbladder:  No gallstones or wall thickening visualized. No sonographic Murphy sign noted by sonographer.  Common bile duct:  Diameter: 2 mm  Liver:  No focal lesion identified. Within normal limits in parenchymal echogenicity.  IMPRESSION: Normal right upper quadrant ultrasound.   Electronically Signed By: Esperanza Heir M.D. On: 03/17/2016 12:27    ____________________________________________   PROCEDURES  Procedure(s) performed: None  Critical Care performed: No  ____________________________________________   INITIAL IMPRESSION / ASSESSMENT AND PLAN / ED COURSE  Pertinent labs & imaging results that were available during my care of the patient were reviewed by me and considered in my medical decision making (see chart for details).  Right upper quadrant pain, intermittent. Presently denies. Does have some very minimal tenderness in the right upper quadrant, no evidence of a surgical abdomen or peritonitis. Given her previous history we will obtain an ultrasound to evaluate for acute renal abnormality though she tells me she has a history of chronic hydronephrosis. She does not seem to have any acute urinary symptoms. Afebrile well-appearing in no distress.  We'll also obtain ultrasound of the right upper quadrant to evaluate for biliary etiology. No pain in the lower abdomen, no pain at McBurney's point and nothing to indicate an obvious need for CT imaging at this time.  ----------------------------------------- 1:05 PM on 03/17/2016 -----------------------------------------  Resting comfortably. No distress. Ultrasounds performed reassuring for no acute. She does not have any acute concern at this time. Urinalysis shows bacteria, leuk trase, lately she may have a mild urinary tract infection. We will treat her for this and advised on careful abdominal return precautions for agreeable.  Return precautions and treatment recommendations  and follow-up discussed with the patient who is agreeable with the plan.  ____________________________________________   FINAL CLINICAL IMPRESSION(S) / ED DIAGNOSES  Final diagnoses:  Abdominal pain  Acute urinary tract infection      Sharyn Creamer, MD 03/17/16 1606

## 2016-03-17 NOTE — ED Notes (Signed)
Patient transported to Ultrasound 

## 2016-03-17 NOTE — ED Notes (Signed)
Pt presents to ED with RUQ pain for two weeks. Pt reports diarrhea for two months. Pt reports vomiting for two weeks. Pt states went to PCP yesterday who recommended a scan but medicaid would not pay for it and pt states she did not want to wait any longer.

## 2016-03-21 LAB — URINE CULTURE
Culture: 10000 — AB
Special Requests: NORMAL

## 2016-07-07 ENCOUNTER — Emergency Department (HOSPITAL_COMMUNITY): Payer: Medicaid Other

## 2016-07-07 ENCOUNTER — Emergency Department (HOSPITAL_COMMUNITY)
Admission: EM | Admit: 2016-07-07 | Discharge: 2016-07-07 | Disposition: A | Discharge status: Home / Self Care | Payer: Medicaid Other | Attending: Physician Assistant | Admitting: Physician Assistant

## 2016-07-07 ENCOUNTER — Encounter (HOSPITAL_COMMUNITY): Payer: Self-pay | Admitting: Emergency Medicine

## 2016-07-07 DIAGNOSIS — N1330 Unspecified hydronephrosis: Secondary | ICD-10-CM | POA: Insufficient documentation

## 2016-07-07 DIAGNOSIS — R109 Unspecified abdominal pain: Secondary | ICD-10-CM | POA: Diagnosis present

## 2016-07-07 DIAGNOSIS — R0981 Nasal congestion: Secondary | ICD-10-CM | POA: Insufficient documentation

## 2016-07-07 DIAGNOSIS — M545 Low back pain: Secondary | ICD-10-CM | POA: Diagnosis not present

## 2016-07-07 LAB — COMPREHENSIVE METABOLIC PANEL
ALBUMIN: 4.1 g/dL (ref 3.5–5.0)
ALT: 20 U/L (ref 14–54)
AST: 19 U/L (ref 15–41)
Alkaline Phosphatase: 66 U/L (ref 38–126)
Anion gap: 6 (ref 5–15)
BUN: 6 mg/dL (ref 6–20)
CALCIUM: 9.3 mg/dL (ref 8.9–10.3)
CHLORIDE: 107 mmol/L (ref 101–111)
CO2: 25 mmol/L (ref 22–32)
CREATININE: 0.61 mg/dL (ref 0.44–1.00)
GFR calc Af Amer: >60 mL/min (ref >60)
GLUCOSE: 85 mg/dL (ref 65–99)
Potassium: 3.6 mmol/L (ref 3.5–5.1)
Sodium: 138 mmol/L (ref 135–145)
TOTAL PROTEIN: 6.7 g/dL (ref 6.5–8.1)
Total Bilirubin: 0.4 mg/dL (ref 0.3–1.2)

## 2016-07-07 LAB — URINALYSIS, ROUTINE W REFLEX MICROSCOPIC
BILIRUBIN URINE: NEGATIVE
Glucose, UA: NEGATIVE mg/dL
Hgb urine dipstick: NEGATIVE
KETONES UR: NEGATIVE mg/dL
Leukocytes, UA: NEGATIVE
NITRITE: NEGATIVE
PH: 5.5 (ref 5.0–8.0)
Protein, ur: NEGATIVE mg/dL
Specific Gravity, Urine: 1.02 (ref 1.005–1.030)

## 2016-07-07 LAB — CBC
HEMATOCRIT: 39.6 % (ref 36.0–46.0)
Hemoglobin: 13.7 g/dL (ref 12.0–15.0)
MCH: 30.6 pg (ref 26.0–34.0)
MCHC: 34.6 g/dL (ref 30.0–36.0)
MCV: 88.4 fL (ref 78.0–100.0)
PLATELETS: 227 10*3/uL (ref 150–400)
RBC: 4.48 MIL/uL (ref 3.87–5.11)
RDW: 12.7 % (ref 11.5–15.5)
WBC: 7.1 10*3/uL (ref 4.0–10.5)

## 2016-07-07 LAB — LIPASE, BLOOD: LIPASE: 32 U/L (ref 11–51)

## 2016-07-07 LAB — PREGNANCY, URINE: Preg Test, Ur: NEGATIVE

## 2016-07-07 MED ORDER — AZITHROMYCIN 250 MG PO TABS: ORAL_TABLET | ORAL | 0 refills | Status: DC

## 2016-07-07 NOTE — ED Triage Notes (Signed)
Pt. Stated, I've had sinus trouble for about a month and I think my kidneys are acting up , Im having back pain. I vomited 3 times last night.

## 2016-07-07 NOTE — Discharge Instructions (Signed)
Please follow-up with your physicians.

## 2016-07-07 NOTE — ED Provider Notes (Signed)
[facility]-EMERGENCY DEPT Provider Note   CSN: [id number] Arrival date & time: 07/07/16  1427  First Provider Contact:  None       History   Chief Complaint Chief Complaint  Patient presents with  . Flank Pain  . Sinus Problem  . Emesis    HPI Debbie Jackson is a 30 y.o. female.  Patient also complain of sinus pressure. Last month.   The history is provided by the patient.  Back Pain   This is a new problem. The current episode started more than 2 days ago. The problem occurs daily. The problem has not changed since onset.The pain is present in the lumbar spine. The quality of the pain is described as stabbing. The pain is at a severity of 7/10. The pain is moderate. The pain is the same all the time (Colicky pain.). Pertinent negatives include no chest pain, no fever, no abdominal pain, no tingling and no weakness. She has tried NSAIDs for the symptoms. The treatment provided no relief.     Patient is a very pleasant 30 year old female presenting with intermittent back pain.  Past Medical History:  Diagnosis Date  . Lupus (systemic lupus erythematosus) (HCC)   . Renal disorder    since childhood    Patient Active Problem List   Diagnosis Date Noted  . Normal labor 06/11/2015  . GBS bacteriuria 05/05/2015    Past Surgical History:  Procedure Laterality Date  . ECTOPIC PREGNANCY SURGERY     right tube removed after burst tubal pregnancy  . KIDNEY SURGERY      OB History    Gravida Para Term Preterm AB Living   SAB TAB Ectopic Multiple Live Births   2 0 1 0 1       Home Medications    Prior to Admission medications   Medication Sig Start Date End Date Taking? Authorizing Provider  cephALEXin (KEFLEX) 500 MG capsule Take 1 capsule (500 mg total) by mouth 4 (four) times daily. 03/17/16   Sharyn Creamer, MD  ibuprofen (ADVIL,MOTRIN) 600 MG tablet Take 1 tablet (600 mg total) by mouth every 6 (six) hours as needed. 06/13/15   Essie Hart, MD  ondansetron  (ZOFRAN ODT) 4 MG disintegrating tablet Take 1 tablet (4 mg total) by mouth every 6 (six) hours as needed for nausea or vomiting. 03/17/16   Sharyn Creamer, MD  oxyCODONE-acetaminophen (PERCOCET/ROXICET) 5-325 MG per tablet Take 1-2 tablets by mouth every 4 (four) hours as needed for severe pain (for pain scale 4-7). 06/13/15   Essie Hart, MD  Prenatal Vit-Fe Fumarate-FA (PRENATAL MULTIVITAMIN) TABS tablet Take 1 tablet by mouth daily at 12 noon.    Historical Provider, MD    Family History Family History  Problem Relation Age of Onset  . Hypertension Mother   . Hypertension Father   . Alcohol abuse Neg Hx   . Arthritis Neg Hx   . Asthma Neg Hx   . Birth defects Neg Hx   . Cancer Neg Hx   . COPD Neg Hx   . Depression Neg Hx   . Diabetes Neg Hx   . Drug abuse Neg Hx   . Early death Neg Hx   . Hearing loss Neg Hx   . Heart disease Neg Hx   . Hyperlipidemia Neg Hx   . Kidney disease Neg Hx   . Learning disabilities Neg Hx   . Mental illness Neg Hx   . Mental  retardation Neg Hx   . Miscarriages / Stillbirths Neg Hx   . Stroke Neg Hx   . Vision loss Neg Hx   . Varicose Veins Neg Hx     Social History Social History  Substance Use Topics  . Smoking status: Never Smoker  . Smokeless tobacco: Never Used  . Alcohol use No     Allergies   Review of patient's allergies indicates no known allergies.   Review of Systems Review of Systems  Constitutional: Negative for activity change and fever.  HENT: Positive for congestion and sinus pressure.   Respiratory: Negative for shortness of breath.   Cardiovascular: Negative for chest pain.  Gastrointestinal: Negative for abdominal pain.  Musculoskeletal: Positive for back pain.  Neurological: Negative for tingling and weakness.     Physical Exam Updated Vital Signs BP 120/75 (BP Location: Right Arm)   Pulse 64   Temp 97.9 F (36.6 C) (Oral)   Resp 17   Ht 5\' 6"  (1.676 m)   Wt 135 lb (61.2 kg)   LMP 06/09/2016   SpO2 100%    BMI 21.79 kg/m   Physical Exam  Constitutional: She is oriented to person, place, and time. She appears well-developed and well-nourished.  HENT:  Head: Normocephalic and atraumatic.  Sinus pressure  Eyes: Right eye exhibits no discharge.  Cardiovascular: Normal rate.   No murmur heard. Pulmonary/Chest: Effort normal.  Abdominal: There is no tenderness.  Mild CVA tenderness.  Musculoskeletal: Normal range of motion.  Neurological: She is oriented to person, place, and time. No cranial nerve deficit.  Skin: Skin is warm and dry. She is not diaphoretic.  Psychiatric: She has a normal mood and affect.  Nursing note and vitals reviewed.    ED Treatments / Results  Labs (all labs ordered are listed, but only abnormal results are displayed) Labs Reviewed  LIPASE, BLOOD  COMPREHENSIVE METABOLIC PANEL  CBC  URINALYSIS, ROUTINE W REFLEX MICROSCOPIC (NOT AT [facility])    EKG  EKG Interpretation None       Radiology No results found.  Procedures Procedures (including critical care time)  Medications Ordered in ED Medications - No data to display   Initial Impression / Assessment and Plan / ED Course  I have reviewed the triage vital signs and the nursing notes.  Pertinent labs & imaging results that were available during my care of the patient were reviewed by me and considered in my medical decision making (see chart for details).  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 She is here because of intermittent colicky pain in her right kidney. She is concerned about a kidney stone. She has history of stone. Patient also here for sinus congestion for the last month. 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 Plan to treat sinus infection over one month with Z-Pak and ENT follow-up when necessary.   8:45 PM CT negative for stones. UA does not show infection. Will treat with  Z-Pak and have follow-up as needed.   Final Clinical Impressions(s) / ED Diagnoses   Final diagnoses:  None    New Prescriptions New Prescriptions   No medications on file     Onesha Krebbs Randall AnLyn Lindi Abram, MD 07/07/16 2046

## 2016-07-08 LAB — URINE CULTURE

## 2016-10-01 ENCOUNTER — Encounter (HOSPITAL_COMMUNITY): Payer: Self-pay

## 2016-10-01 ENCOUNTER — Emergency Department (HOSPITAL_COMMUNITY): Payer: Self-pay

## 2016-10-01 ENCOUNTER — Emergency Department (HOSPITAL_COMMUNITY)
Admission: EM | Admit: 2016-10-01 | Discharge: 2016-10-02 | Disposition: A | Discharge status: Home / Self Care | Payer: Self-pay | Attending: Emergency Medicine | Admitting: Emergency Medicine

## 2016-10-01 DIAGNOSIS — Z3A08 8 weeks gestation of pregnancy: Secondary | ICD-10-CM

## 2016-10-01 DIAGNOSIS — O0281 Inappropriate change in quantitative human chorionic gonadotropin (hCG) in early pregnancy: Secondary | ICD-10-CM | POA: Insufficient documentation

## 2016-10-01 DIAGNOSIS — B9689 Other specified bacterial agents as the cause of diseases classified elsewhere: Secondary | ICD-10-CM

## 2016-10-01 DIAGNOSIS — N760 Acute vaginitis: Secondary | ICD-10-CM

## 2016-10-01 DIAGNOSIS — O23591 Infection of other part of genital tract in pregnancy, first trimester: Secondary | ICD-10-CM | POA: Insufficient documentation

## 2016-10-01 DIAGNOSIS — O4690 Antepartum hemorrhage, unspecified, unspecified trimester: Secondary | ICD-10-CM

## 2016-10-01 LAB — I-STAT BETA HCG BLOOD, ED (MC, WL, AP ONLY)

## 2016-10-01 LAB — I-STAT CHEM 8, ED
BUN: 9 mg/dL (ref 6–20)
CALCIUM ION: 1.16 mmol/L (ref 1.15–1.40)
Chloride: 104 mmol/L (ref 101–111)
Creatinine, Ser: 0.6 mg/dL (ref 0.44–1.00)
GLUCOSE: 79 mg/dL (ref 65–99)
HCT: 35 % — ABNORMAL LOW (ref 36.0–46.0)
HEMOGLOBIN: 11.9 g/dL — AB (ref 12.0–15.0)
Potassium: 3.1 mmol/L — ABNORMAL LOW (ref 3.5–5.1)
SODIUM: 139 mmol/L (ref 135–145)
TCO2: 23 mmol/L (ref 0–100)

## 2016-10-01 LAB — WET PREP, GENITAL
SPERM: NONE SEEN
TRICH WET PREP: NONE SEEN
WBC, Wet Prep HPF POC: NONE SEEN
YEAST WET PREP: NONE SEEN

## 2016-10-01 LAB — RH IG WORKUP (INCLUDES ABO/RH)
ABO/RH(D): O POS
Gestational Age(Wks): 3

## 2016-10-01 NOTE — ED Notes (Signed)
Patient taken to Ultrasound

## 2016-10-01 NOTE — ED Provider Notes (Signed)
[facility]-EMERGENCY DEPT Provider Note   CSN: [id number] Arrival date & time: 10/01/16  1929   History   Chief Complaint Chief Complaint  Patient presents with  . Abdominal Pain    HPI Debbie Jackson is a 30 y.o. female.  HPI  Patient has hx of lupus, renal disorder, GBS bacteria, ectopic tubal pregnancy requiring right tubal removal comes to the ED with spotting and abdominal cramping. She obtained a positive pregnancy test 2 weeks ago which she is excited about and came to be evaluated. She called her OB but has been unable to reach them. She is no longer having spotting or cramping at this time. She has not had fever, back pain, N/V/D, weakness, dysuria, or vaginal discharge.  Past Medical History:  Diagnosis Date  . Lupus (systemic lupus erythematosus) (HCC)   . Renal disorder    since childhood    Patient Active Problem List   Diagnosis Date Noted  . Normal labor 06/11/2015  . GBS bacteriuria 05/05/2015    Past Surgical History:  Procedure Laterality Date  . ECTOPIC PREGNANCY SURGERY     right tube removed after burst tubal pregnancy  . KIDNEY SURGERY      OB History    Gravida Para Term Preterm AB Living   6 3 3   3 1    SAB TAB Ectopic Multiple Live Births   2 0 1 0 1       Home Medications    Prior to Admission medications   Medication Sig Start Date End Date Taking? Authorizing Provider  azithromycin (ZITHROMAX Z-PAK) 250 MG tablet Take one daily for 4 days Patient not taking: Reported on 10/01/2016 07/07/16   Courteney Lyn Mackuen, MD  cephALEXin (KEFLEX) 500 MG capsule Take 1 capsule (500 mg total) by mouth 4 (four) times daily. Patient not taking: Reported on 10/01/2016 03/17/16   Sharyn Creamer, MD  ibuprofen (ADVIL,MOTRIN) 600 MG tablet Take 1 tablet (600 mg total) by mouth every 6 (six) hours as needed. Patient not taking: Reported on 10/01/2016 06/13/15   Essie Hart, MD  metroNIDAZOLE (FLAGYL) 500 MG tablet Take 1 tablet (500 mg total) by mouth 2 (two) times  daily. 10/02/16   Sanjuan Sawa Neva Seat, PA-C  ondansetron (ZOFRAN ODT) 4 MG disintegrating tablet Take 1 tablet (4 mg total) by mouth every 6 (six) hours as needed for nausea or vomiting. Patient not taking: Reported on 10/01/2016 03/17/16   Sharyn Creamer, MD  oxyCODONE-acetaminophen (PERCOCET/ROXICET) 5-325 MG per tablet Take 1-2 tablets by mouth every 4 (four) hours as needed for severe pain (for pain scale 4-7). Patient not taking: Reported on 10/01/2016 06/13/15   Essie Hart, MD    Family History Family History  Problem Relation Age of Onset  . Hypertension Mother   . Hypertension Father   . Alcohol abuse Neg Hx   . Arthritis Neg Hx   . Asthma Neg Hx   . Birth defects Neg Hx   . Cancer Neg Hx   . COPD Neg Hx   . Depression Neg Hx   . Diabetes Neg Hx   . Drug abuse Neg Hx   . Early death Neg Hx   . Hearing loss Neg Hx   . Heart disease Neg Hx   . Hyperlipidemia Neg Hx   . Kidney disease Neg Hx   . Learning disabilities Neg Hx   . Mental illness Neg Hx   . Mental retardation Neg Hx   . Miscarriages / Stillbirths Neg Hx   .  Stroke Neg Hx   . Vision loss Neg Hx   . Varicose Veins Neg Hx     Social History Social History  Substance Use Topics  . Smoking status: Never Smoker  . Smokeless tobacco: Never Used  . Alcohol use No     Allergies   Patient has no known allergies.   Review of Systems Review of Systems  Review of Systems All other systems negative except as documented in the HPI. All pertinent positives and negatives as reviewed in the HPI.  Physical Exam Updated Vital Signs BP 112/67 (BP Location: Right Arm)   Pulse 85   Temp 98.6 F (37 C) (Oral)   Resp 12   Ht 5\' 6"  (1.676 m)   Wt 62.9 kg   LMP 08/11/2016   SpO2 100%   BMI 22.36 kg/m   Physical Exam  Constitutional: She appears well-developed and well-nourished.  HENT:  Head: Normocephalic and atraumatic.  Eyes: Conjunctivae are normal. Pupils are equal, round, and reactive to light.  Neck: Trachea  normal, normal range of motion and full passive range of motion without pain. Neck supple.  Cardiovascular: Normal rate, regular rhythm and normal pulses.   Pulmonary/Chest: Effort normal and breath sounds normal. Chest wall is not dull to percussion. She exhibits no tenderness, no crepitus, no edema, no deformity and no retraction.  Abdominal: Soft. Normal appearance and bowel sounds are normal.  Genitourinary: Vagina normal and uterus normal. There is no rash or tenderness on the right labia. There is no rash or tenderness on the left labia. Cervix exhibits no motion tenderness, no discharge and no friability. Right adnexum displays no mass and no tenderness. Left adnexum displays no mass and no tenderness.  Genitourinary Comments: Cervix is closed, no blood in vaginal vault  Musculoskeletal: Normal range of motion.  Neurological: She is alert. She has normal strength.  Skin: Skin is warm, dry and intact.  Psychiatric: She has a normal mood and affect. Her speech is normal and behavior is normal. Judgment and thought content normal. Cognition and memory are normal.     ED Treatments / Results  Labs (all labs ordered are listed, but only abnormal results are displayed) Labs Reviewed  WET PREP, GENITAL - Abnormal; Notable for the following:       Result Value   Clue Cells Wet Prep HPF POC PRESENT (*)    All other components within normal limits  HCG, QUANTITATIVE, PREGNANCY - Abnormal; Notable for the following:    hCG, Beta Chain, Quant, S 79,236 (*)    All other components within normal limits  I-STAT BETA HCG BLOOD, ED ([facility], [facility], [facility] ONLY) - Abnormal; Notable for the following:    I-stat hCG, quantitative >2,000.0 (*)    All other components within normal limits  I-STAT CHEM 8, ED - Abnormal; Notable for the following:    Potassium 3.1 (*)    Hemoglobin 11.9 (*)    HCT 35.0 (*)    All other components within normal limits  RH IG WORKUP (INCLUDES ABO/RH)  GC/CHLAMYDIA PROBE AMP ([facility]) NOT AT [facility]    EKG  EKG Interpretation None       Radiology [place] Ob Comp Less 14 Wks  Result Date: 10/02/2016 CLINICAL DATA:  Vaginal bleeding. EXAM: OBSTETRIC <14 WK US AND TRANSVAGINAL OB US TECHNIQUE: Both transabdominal and transvaginal ultrasound examinations were performed for complete evaluation of the gestation as well as the maternal uterus, adnexal regions, and pelvic cul-de-sac. Transvaginal technique was performed  to assess early pregnancy. COMPARISON:  None. FINDINGS: Intrauterine gestational sac: Single Yolk sac:  Visible Embryo:  Visible Cardiac Activity: Visible Heart Rate: 178  bpm MSD:   mm    w     d CRL:  16.8  mm   8 w   1 d                  US EDC: 05/12/2017 Subchorionic hemorrhage:  Small Maternal uterus/adnexae: Normal ovaries, with left corpus luteum. No abnormal pelvic fluid collection. IMPRESSION: Single living intrauterine gestation measuring 8 weeks 1 day by crown-rump length. Small subchorionic hemorrhage. Electronically Signed   By: Ellery Plunkaniel R Mitchell M.D.   On: 10/02/2016 01:02   [place] Ob Transvaginal  Result Date: 10/02/2016 CLINICAL DATA:  Vaginal bleeding. EXAM: OBSTETRIC <14 WK US AND TRANSVAGINAL OB US TECHNIQUE: Both transabdominal and transvaginal ultrasound examinations were performed for complete evaluation of the gestation as well as the maternal uterus, adnexal regions, and pelvic cul-de-sac. Transvaginal technique was performed to assess early pregnancy. COMPARISON:  None. FINDINGS: Intrauterine gestational sac: Single Yolk sac:  Visible Embryo:  Visible Cardiac Activity: Visible Heart Rate: 178  bpm MSD:   mm    w     d CRL:  16.8  mm   8 w   1 d                  US EDC: 05/12/2017 Subchorionic hemorrhage:  Small Maternal uterus/adnexae: Normal ovaries, with left corpus luteum. No abnormal pelvic fluid collection. IMPRESSION: Single living intrauterine gestation measuring 8 weeks 1 day by crown-rump length. Small subchorionic hemorrhage. Electronically  Signed   By: Ellery Plunkaniel R Mitchell M.D.   On: 10/02/2016 01:02    Procedures Procedures (including critical care time)  Medications Ordered in ED Medications - No data to display   Initial Impression / Assessment and Plan / ED Course  I have reviewed the triage vital signs and the nursing notes.  Pertinent labs & imaging results that were available during my care of the patient were reviewed by me and considered in my medical decision making (see chart for details).  Clinical Course     Confirmed IUP, 8 weeks. Pt no longer bleeding or having cramping. Advised that anytime there is bleeding the pregnancy is threatened and it is too early to Prevent miscarriage.  She has a OB she plans to follow-up with as soon as she can. On prenatals at home. Discussed return precautions.  Final Clinical Impressions(s) / ED Diagnoses   Final diagnoses:  BV (bacterial vaginosis)  [redacted] weeks gestation of pregnancy    New Prescriptions Discharge Medication List as of 10/02/2016  1:20 AM    START taking these medications   Details  metroNIDAZOLE (FLAGYL) 500 MG tablet Take 1 tablet (500 mg total) by mouth 2 (two) times daily., Starting Sun 10/02/2016, Print         Marlon Peliffany Ladislav Caselli, PA-C 10/02/16 2214    Dione Boozeavid Glick, MD 10/02/16 [phone]

## 2016-10-01 NOTE — ED Triage Notes (Signed)
Pt states that she found out she was pregnant two weeks ago, today started having some abd cramping and this morning a little bit of vaginal bleeding that has now stopped. Last LMP 9/14, pt has a past medical hx of miscarriages and tubal litigation

## 2016-10-02 LAB — HCG, QUANTITATIVE, PREGNANCY: hCG, Beta Chain, Quant, S: 79236 m[IU]/mL — ABNORMAL HIGH (ref <5)

## 2016-10-02 MED ORDER — METRONIDAZOLE 500 MG PO TABS: 500.0000 mg | ORAL_TABLET | Freq: Two times a day (BID) | ORAL | 0 refills | Status: DC

## 2016-10-03 LAB — GC/CHLAMYDIA PROBE AMP (~~LOC~~) NOT AT ARMC
Chlamydia: NEGATIVE
Neisseria Gonorrhea: NEGATIVE

## 2016-11-09 ENCOUNTER — Emergency Department (HOSPITAL_COMMUNITY): Payer: Self-pay

## 2016-11-09 ENCOUNTER — Encounter (HOSPITAL_COMMUNITY): Payer: Self-pay | Admitting: Emergency Medicine

## 2016-11-09 ENCOUNTER — Emergency Department (HOSPITAL_COMMUNITY)
Admission: EM | Admit: 2016-11-09 | Discharge: 2016-11-09 | Disposition: A | Discharge status: Home / Self Care | Payer: Self-pay | Attending: Emergency Medicine | Admitting: Emergency Medicine

## 2016-11-09 DIAGNOSIS — N1330 Unspecified hydronephrosis: Secondary | ICD-10-CM | POA: Insufficient documentation

## 2016-11-09 DIAGNOSIS — Z79899 Other long term (current) drug therapy: Secondary | ICD-10-CM | POA: Insufficient documentation

## 2016-11-09 DIAGNOSIS — R109 Unspecified abdominal pain: Secondary | ICD-10-CM

## 2016-11-09 LAB — URINALYSIS, ROUTINE W REFLEX MICROSCOPIC
BACTERIA UA: NONE SEEN
Bilirubin Urine: NEGATIVE
GLUCOSE, UA: NEGATIVE mg/dL
KETONES UR: NEGATIVE mg/dL
Leukocytes, UA: NEGATIVE
Nitrite: NEGATIVE
PROTEIN: NEGATIVE mg/dL
Specific Gravity, Urine: 1.017 (ref 1.005–1.030)
pH: 5 (ref 5.0–8.0)

## 2016-11-09 LAB — CBC WITH DIFFERENTIAL/PLATELET
BASOS ABS: 0 10*3/uL (ref 0.0–0.1)
BASOS PCT: 1 %
Eosinophils Absolute: 0.1 10*3/uL (ref 0.0–0.7)
Eosinophils Relative: 1 %
HCT: 36.9 % (ref 36.0–46.0)
Hemoglobin: 12.5 g/dL (ref 12.0–15.0)
Lymphocytes Relative: 32 %
Lymphs Abs: 2.3 10*3/uL (ref 0.7–4.0)
MCH: 30.5 pg (ref 26.0–34.0)
MCHC: 33.9 g/dL (ref 30.0–36.0)
MCV: 90 fL (ref 78.0–100.0)
MONO ABS: 0.4 10*3/uL (ref 0.1–1.0)
Monocytes Relative: 6 %
NEUTROS ABS: 4.4 10*3/uL (ref 1.7–7.7)
NEUTROS PCT: 60 %
Platelets: 177 10*3/uL (ref 150–400)
RBC: 4.1 MIL/uL (ref 3.87–5.11)
RDW: 13.8 % (ref 11.5–15.5)
WBC: 7.2 10*3/uL (ref 4.0–10.5)

## 2016-11-09 LAB — COMPREHENSIVE METABOLIC PANEL
ALBUMIN: 4.4 g/dL (ref 3.5–5.0)
ALT: 15 U/L (ref 14–54)
AST: 17 U/L (ref 15–41)
Alkaline Phosphatase: 58 U/L (ref 38–126)
Anion gap: 6 (ref 5–15)
BILIRUBIN TOTAL: 1 mg/dL (ref 0.3–1.2)
BUN: 9 mg/dL (ref 6–20)
CHLORIDE: 107 mmol/L (ref 101–111)
CO2: 25 mmol/L (ref 22–32)
Calcium: 8.9 mg/dL (ref 8.9–10.3)
Creatinine, Ser: 0.51 mg/dL (ref 0.44–1.00)
GFR calc Af Amer: >60 mL/min (ref >60)
GFR calc non Af Amer: >60 mL/min (ref >60)
GLUCOSE: 84 mg/dL (ref 65–99)
POTASSIUM: 4 mmol/L (ref 3.5–5.1)
Sodium: 138 mmol/L (ref 135–145)
Total Protein: 7.3 g/dL (ref 6.5–8.1)

## 2016-11-09 LAB — I-STAT BETA HCG BLOOD, ED (MC, WL, AP ONLY): I-stat hCG, quantitative: 36.8 m[IU]/mL — ABNORMAL HIGH (ref <5)

## 2016-11-09 NOTE — ED Provider Notes (Signed)
[facility]-EMERGENCY DEPT Provider Note   CSN: [id number] Arrival date & time: 11/09/16  0945     History   Chief Complaint Chief Complaint  Patient presents with  . Flank Pain    HPI Nonnie DoneRebecca Meriweather is a 30 y.o. female.  HPI   Hx double kidney system, had to take one out, hydronephrosis, hx of kidney problems, no insurance to see Urologist  Yesterday began to have swelling in hands Feeling urgency, hesitancy for 2 weeks Right flank pain, aching pain, for one month, constant, no radiation Low appetite Nausea No vomiting, no fevers No trauma/falls Similar pain in the past   Past Medical History:  Diagnosis Date  . Lupus (systemic lupus erythematosus) (HCC)   . Renal disorder    since childhood    Patient Active Problem List   Diagnosis Date Noted  . Normal labor 06/11/2015  . GBS bacteriuria 05/05/2015    Past Surgical History:  Procedure Laterality Date  . ECTOPIC PREGNANCY SURGERY     right tube removed after burst tubal pregnancy  . KIDNEY SURGERY      OB History    Gravida Para Term Preterm AB Living   6 3 3   3 1    SAB TAB Ectopic Multiple Live Births   2 0 1 0 1       Home Medications    Prior to Admission medications   Medication Sig Start Date End Date Taking? Authorizing Provider  SPRINTEC 28 0.25-35 MG-MCG tablet Take 1 tablet by mouth at bedtime.  11/01/16  Yes Historical Provider, MD  azithromycin (ZITHROMAX Z-PAK) 250 MG tablet Take one daily for 4 days Patient not taking: Reported on 10/01/2016 07/07/16   Courteney Lyn Mackuen, MD  metroNIDAZOLE (FLAGYL) 500 MG tablet Take 1 tablet (500 mg total) by mouth 2 (two) times daily. 10/02/16   Marlon Peliffany Greene, PA-C    Family History Family History  Problem Relation Age of Onset  . Hypertension Mother   . Hypertension Father   . Alcohol abuse Neg Hx   . Arthritis Neg Hx   . Asthma Neg Hx   . Birth defects Neg Hx   . Cancer Neg Hx   . COPD Neg Hx   . Depression Neg Hx   . Diabetes Neg Hx     . Drug abuse Neg Hx   . Early death Neg Hx   . Hearing loss Neg Hx   . Heart disease Neg Hx   . Hyperlipidemia Neg Hx   . Kidney disease Neg Hx   . Learning disabilities Neg Hx   . Mental illness Neg Hx   . Mental retardation Neg Hx   . Miscarriages / Stillbirths Neg Hx   . Stroke Neg Hx   . Vision loss Neg Hx   . Varicose Veins Neg Hx     Social History Social History  Substance Use Topics  . Smoking status: Never Smoker  . Smokeless tobacco: Never Used  . Alcohol use No     Allergies   Patient has no known allergies.   Review of Systems Review of Systems  Constitutional: Positive for appetite change. Negative for fever.  HENT: Negative for sore throat.   Eyes: Negative for visual disturbance.  Respiratory: Negative for cough and shortness of breath.   Cardiovascular: Negative for chest pain.  Gastrointestinal: Positive for nausea. Negative for abdominal pain (on and off for a while), constipation, diarrhea and vomiting.  Genitourinary: Positive for frequency and urgency. Negative for difficulty  urinating, vaginal bleeding and vaginal discharge.  Musculoskeletal: Negative for back pain and neck pain.  Skin: Negative for rash.  Neurological: Positive for light-headedness (dizziness for one month). Negative for syncope and headaches.     Physical Exam Updated Vital Signs BP 109/67 (BP Location: Left Arm)   Pulse 63   Temp 98.1 F (36.7 C) (Oral)   Resp 16   LMP 10/29/2016   SpO2 100%   Physical Exam  Constitutional: She is oriented to person, place, and time. She appears well-developed and well-nourished. No distress.  HENT:  Head: Normocephalic and atraumatic.  Eyes: Conjunctivae and EOM are normal.  Neck: Normal range of motion.  Cardiovascular: Normal rate, regular rhythm, normal heart sounds and intact distal pulses.  Exam reveals no gallop and no friction rub.   No murmur heard. Pulmonary/Chest: Effort normal and breath sounds normal. No respiratory  distress. She has no wheezes. She has no rales.  Abdominal: Soft. She exhibits no distension. There is no tenderness. There is CVA tenderness (R). There is no guarding.  Musculoskeletal: She exhibits no edema or tenderness.  Neurological: She is alert and oriented to person, place, and time.  Skin: Skin is warm and dry. No rash noted. She is not diaphoretic. No erythema.  Nursing note and vitals reviewed.    ED Treatments / Results  Labs (all labs ordered are listed, but only abnormal results are displayed) Labs Reviewed  URINALYSIS, ROUTINE W REFLEX MICROSCOPIC - Abnormal; Notable for the following:       Result Value   Hgb urine dipstick SMALL (*)    Squamous Epithelial / LPF 0-5 (*)    All other components within normal limits  I-STAT BETA HCG BLOOD, ED ([facility], [facility], [facility] ONLY) - Abnormal; Notable for the following:    I-stat hCG, quantitative 36.8 (*)    All other components within normal limits  CBC WITH DIFFERENTIAL/PLATELET  COMPREHENSIVE METABOLIC PANEL    EKG  EKG Interpretation None       Radiology Dg Abdomen 1 View  Result Date: 11/09/2016 CLINICAL DATA:  Flank pain.  Dysuria. EXAM: ABDOMEN - 1 VIEW COMPARISON:  CT 18 2017 . FINDINGS: Soft tissue structures are unremarkable. No bowel distention. Stool noted throughout the colon. Pelvic calcifications noted consistent phleboliths. No acute bony abnormality. IMPRESSION: Stool noted throughout the colon. No bowel distention. No acute abnormality. Electronically Signed   By: Maisie Fus  Register   On: 11/09/2016 11:08   US Renal  Result Date: 11/09/2016 CLINICAL DATA:  Right flank pain. EXAM: RENAL / URINARY TRACT ULTRASOUND COMPLETE COMPARISON:  CT 07/07/2016. FINDINGS: Right Kidney: Length: 10.6 cm. Echogenicity within normal limits. No mass. Severe right hydronephrosis. No change from prior exam. Left Kidney: Length: 14.5 cm. Echogenicity within normal limits. No mass or hydronephrosis visualized. Bladder: Appears normal for  degree of bladder distention. IMPRESSION: 1. Severe chronic right hydronephrosis. 2. Exam is otherwise unremarkable . Electronically Signed   By: Maisie Fus  Register   On: 11/09/2016 11:20    Procedures Procedures (including critical care time)  Medications Ordered in ED Medications - No data to display   Initial Impression / Assessment and Plan / ED Course  I have reviewed the triage vital signs and the nursing notes.  Pertinent labs & imaging results that were available during my care of the patient were reviewed by me and considered in my medical decision making (see chart for details).  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 Exam not consistent with appendicitis or pyelonephritis. Labs WNL. Doubt torsion, PID given pain flank, not pelvic.  Urinalysis without infection.  US shows no change in chronic mild hydronephrosis. XR without nephrolithiasis. HCG 36--pt with miscarriage one month ago, hcg likely decreasing from level in November. Pt without lower pain, hx of salpingectomy on R, and low suspicion hcg level represents new pregnancy at this time. Recommend follow up hcg level in 48hr.  Pt likely with exacerbation of pain from chronic hydronephrosis and rec continued follow up with urology and ob for recent miscarriage. Patient discharged in stable condition with understanding of reasons to return.   Final Clinical Impressions(s) / ED Diagnoses   Final diagnoses:  Right flank pain  Hydronephrosis of right kidney    New Prescriptions Discharge Medication List as of 11/09/2016  1:46 PM       Alvira MondayErin Jamiria Langill, MD 11/09/16 2319

## 2016-11-09 NOTE — ED Triage Notes (Signed)
Per pt, states right flank pain and dysuria for weeks-states symptoms getting worse-history of kidney issues

## 2017-04-05 ENCOUNTER — Encounter: Payer: Self-pay | Admitting: Family Medicine

## 2017-08-11 ENCOUNTER — Emergency Department (HOSPITAL_COMMUNITY)
Admission: EM | Admit: 2017-08-11 | Discharge: 2017-08-11 | Disposition: A | Discharge status: Home / Self Care | Payer: Self-pay | Attending: Emergency Medicine | Admitting: Emergency Medicine

## 2017-08-11 ENCOUNTER — Encounter (HOSPITAL_COMMUNITY): Payer: Self-pay | Admitting: *Deleted

## 2017-08-11 DIAGNOSIS — Z79899 Other long term (current) drug therapy: Secondary | ICD-10-CM | POA: Insufficient documentation

## 2017-08-11 DIAGNOSIS — A084 Viral intestinal infection, unspecified: Secondary | ICD-10-CM

## 2017-08-11 DIAGNOSIS — J020 Streptococcal pharyngitis: Secondary | ICD-10-CM

## 2017-08-11 DIAGNOSIS — R197 Diarrhea, unspecified: Secondary | ICD-10-CM | POA: Insufficient documentation

## 2017-08-11 LAB — URINALYSIS, ROUTINE W REFLEX MICROSCOPIC
Bilirubin Urine: NEGATIVE
Glucose, UA: NEGATIVE mg/dL
KETONES UR: 20 mg/dL — AB
Leukocytes, UA: NEGATIVE
Nitrite: NEGATIVE
PROTEIN: NEGATIVE mg/dL
Specific Gravity, Urine: 1.016 (ref 1.005–1.030)
pH: 5 (ref 5.0–8.0)

## 2017-08-11 LAB — COMPREHENSIVE METABOLIC PANEL
ALK PHOS: 62 U/L (ref 38–126)
ALT: 22 U/L (ref 14–54)
AST: 19 U/L (ref 15–41)
Albumin: 3.6 g/dL (ref 3.5–5.0)
Anion gap: 6 (ref 5–15)
BILIRUBIN TOTAL: 1 mg/dL (ref 0.3–1.2)
BUN: 6 mg/dL (ref 6–20)
CALCIUM: 8.5 mg/dL — AB (ref 8.9–10.3)
CO2: 23 mmol/L (ref 22–32)
CREATININE: 0.72 mg/dL (ref 0.44–1.00)
Chloride: 107 mmol/L (ref 101–111)
GFR calc non Af Amer: >60 mL/min (ref >60)
Glucose, Bld: 92 mg/dL (ref 65–99)
Potassium: 3.3 mmol/L — ABNORMAL LOW (ref 3.5–5.1)
Sodium: 136 mmol/L (ref 135–145)
TOTAL PROTEIN: 6.6 g/dL (ref 6.5–8.1)

## 2017-08-11 LAB — CBC
HCT: 39 % (ref 36.0–46.0)
Hemoglobin: 13.3 g/dL (ref 12.0–15.0)
MCH: 29.8 pg (ref 26.0–34.0)
MCHC: 34.1 g/dL (ref 30.0–36.0)
MCV: 87.2 fL (ref 78.0–100.0)
Platelets: 175 10*3/uL (ref 150–400)
RBC: 4.47 MIL/uL (ref 3.87–5.11)
RDW: 12.4 % (ref 11.5–15.5)
WBC: 13.8 10*3/uL — ABNORMAL HIGH (ref 4.0–10.5)

## 2017-08-11 LAB — RAPID STREP SCREEN (MED CTR MEBANE ONLY): STREPTOCOCCUS, GROUP A SCREEN (DIRECT): POSITIVE — AB

## 2017-08-11 LAB — LIPASE, BLOOD: Lipase: 35 U/L (ref 11–51)

## 2017-08-11 MED ORDER — IBUPROFEN 100 MG/5ML PO SUSP: 200.0000 mg | Freq: Four times a day (QID) | ORAL | 0 refills | Status: DC | PRN

## 2017-08-11 MED ORDER — PENICILLIN G BENZATHINE 1200000 UNIT/2ML IM SUSP
1.2000 10*6.[IU] | Freq: Once | INTRAMUSCULAR | Status: AC
  Administered 2017-08-11: 1.2 10*6.[IU] via INTRAMUSCULAR
  Filled 2017-08-11: qty 2

## 2017-08-11 MED ORDER — IBUPROFEN 100 MG/5ML PO SUSP
400.0000 mg | Freq: Once | ORAL | Status: AC
  Administered 2017-08-11: 400 mg via ORAL
  Filled 2017-08-11: qty 20

## 2017-08-11 NOTE — ED Triage Notes (Signed)
Pt reports having recent GI virus with diarrhea since Tuesday. Now also has bodyaches, fever, chills, headache and sore throat. Mask on pt at triage.

## 2017-08-11 NOTE — ED Provider Notes (Signed)
[facility]-EMERG[facility]Y DEPT Provider Note   CSN: [id number] Arrival date & time: 08/11/17  1024  History   Chief Complaint Chief Complaint  Patient presents with  . Sore Throat  . Diarrhea   HPI  Debbie Jackson is a 31yo female with history of ?lupus and kidney problems, including double kidney system with surgery and chronic hydronephrosis, who presents with sore throat and myalgias for the last couple days. She had the "stomach bug" with diarrhea and abdominal pain since Tuesday. Denies N/V. Did not take any meds for this. The diarrhea and abdominal pain have improved (still with diarrhea 4x/day). Since two days ago, she began developing fever, chills, myalgia, headache, cough, congestion, and sore throat. She has not been able to eat much due to the pain in her throat. She tried advil which did not help. Has not tried anything for her throat.  Denies chest pain, shortness of breath, or new rash. No recent travel or hiking in the woods. Denies dysuria or LE edema.  Works at a daycare. Lives at home with kids and a dog. Denies smoking, alcohol, or other illicit drugs.  Past Medical History:  Diagnosis Date  . Lupus (systemic lupus erythematosus) (HCC)   . Renal disorder    since childhood    Patient Active Problem List   Diagnosis Date Noted  . Normal labor 06/11/2015  . GBS bacteriuria 05/05/2015    Past Surgical History:  Procedure Laterality Date  . ECTOPIC PREGNANCY SURGERY     right tube removed after burst tubal pregnancy  . KIDNEY SURGERY      OB History    Gravida Para Term Preterm AB Living   SAB TAB Ectopic Multiple Live Births   2 0 1 0 1       Home Medications    Prior to Admission medications   Medication Sig Start Date End Date Taking? Authorizing Provider  NIKKI 3-0.02 MG tablet Take 1 tablet by mouth daily. 07/16/17  Yes [provider]  ibuprofen (ADVIL,MOTRIN) 100 MG/5ML suspension Take 10 mLs (200 mg total) by mouth every 6 (six) hours  as needed (sore throat and/or muscle aches). 08/11/17   Scherrie Gerlach, MD    Family History Family History  Problem Relation Age of Onset  . Hypertension Mother   . Hypertension Father   . Alcohol abuse Neg Hx   . Arthritis Neg Hx   . Asthma Neg Hx   . Birth defects Neg Hx   . Cancer Neg Hx   . COPD Neg Hx   . Depression Neg Hx   . Diabetes Neg Hx   . Drug abuse Neg Hx   . Early death Neg Hx   . Hearing loss Neg Hx   . Heart disease Neg Hx   . Hyperlipidemia Neg Hx   . Kidney disease Neg Hx   . Learning disabilities Neg Hx   . Mental illness Neg Hx   . Mental retardation Neg Hx   . Miscarriages / Stillbirths Neg Hx   . Stroke Neg Hx   . Vision loss Neg Hx   . Varicose Veins Neg Hx     Social History Social History  Substance Use Topics  . Smoking status: Never Smoker  . Smokeless tobacco: Never Used  . Alcohol use No     Allergies   Patient has no known allergies.   Review of Systems Review of Systems  Unremarkable except as stated above  in HPI.  Physical Exam Updated Vital Signs BP 106/73 (BP Location: Right Arm)   Pulse 90   Temp 99.1 F (37.3 C) (Oral)   Resp 16   LMP 07/19/2017   SpO2 98%   Physical Exam  GEN: Well-appearing, young female lying in bed comfortably in NAD; alert and oriented HENT: Moist mucus membranes. Tonsillar hypertrophy and oropharyngeal erythema. EYES: PERRL. Sclera anicteric. RESP: Clear to auscultation bilaterally. No wheezes, rales, or rhonchi. CV: Normal rate and regular rhythm. No murmurs, gallops, or rubs. No LE edema. ABD: Soft. Soreness to palpation diffusely. Non-distended. Decreased bowel sounds. BACK: No CVA tenderness. EXT: No edema. Warm. 2+ radial and DP pulses. Capillary refill <2 sec. NEURO: Cranial nerves II-XII grossly intact. Able to lift all four extremities against gravity. Speech fluent and appropriate.  ED Treatments / Results  Labs (all labs ordered are listed, but only abnormal results are  displayed) Labs Reviewed  RAPID STREP SCREEN (NOT AT [facility]) - Abnormal; Notable for the following:       Result Value   Streptococcus, Group A Screen (Direct) POSITIVE (*)    All other components within normal limits  COMPREHENSIVE METABOLIC PANEL - Abnormal; Notable for the following:    Potassium 3.3 (*)    Calcium 8.5 (*)    All other components within normal limits  CBC - Abnormal; Notable for the following:    WBC 13.8 (*)    All other components within normal limits  URINALYSIS, ROUTINE W REFLEX MICROSCOPIC - Abnormal; Notable for the following:    Hgb urine dipstick SMALL (*)    Ketones, ur 20 (*)    Bacteria, UA RARE (*)    Squamous Epithelial / LPF 0-5 (*)    All other components within normal limits  LIPASE, BLOOD    EKG  EKG Interpretation None       Radiology No results found.  Procedures Procedures (including critical care time)  Medications Ordered in ED Medications  penicillin g benzathine (BICILLIN LA) 1200000 UNIT/2ML injection 1.2 Million Units (1.2 Million Units Intramuscular Given 08/11/17 1241)  ibuprofen (ADVIL,MOTRIN) 100 MG/5ML suspension 400 mg (400 mg Oral Given 08/11/17 1240)     Initial Impression / Assessment and Plan / ED Course  I have reviewed the triage vital signs and the nursing notes.  Pertinent labs & imaging results that were available during my care of the patient were reviewed by me and considered in my medical decision making (see chart for details).  Debbie Jackson is a 31yo female with hx of chronic hydronephrosis, kidney problems, and ?lupus who presents with sore throat and myalgias for the last two days, as well as diarrhea and abdominal pain since Tuesday. +sick contacts at daycare. Rapid strep test positive. UA negative for UTI, CMP wnl. WBC elevated to 13.8. Lipase nl. Afebrile and HDS here. GAS pharyngitis and likely viral stomach flu. Patient is tolerating PO intake. Will give liquid motrin as needed for sore throat and  myalgias, as well as IM bicillin.   1:08pm Liquid motrin and IM bicillin administered. Patient feeling well and tolerating PO intake. Prescription for liquid motrin provided and patient advised that she can take imodium as needed for diarrhea. Patient stable for discharge and return precautions provided.  Patient seen and discussed with Dr. Eudelia Bunch.  Final Clinical Impressions(s) / ED Diagnoses   Final diagnoses:  Strep pharyngitis  Viral gastroenteritis    New Prescriptions New Prescriptions   IBUPROFEN (ADVIL,MOTRIN) 100 MG/5ML SUSPENSION    Take  10 mLs (200 mg total) by mouth every 6 (six) hours as needed (sore throat and/or muscle aches).     Scherrie Gerlach, MD 08/11/17 1310    Nira Conn, MD 08/11/17 [phone]

## 2017-08-11 NOTE — Discharge Instructions (Signed)
You have strep throat. You received a shot of penicillin. You can take liquid motrin  every 6 hours as needed for sore throat or muscle aches.  You also have a viral stomach bug. You can take over the counter Imodium as needed for diarrhea.

## 2017-09-25 ENCOUNTER — Emergency Department
Admission: EM | Admit: 2017-09-25 | Discharge: 2017-09-25 | Disposition: A | Discharge status: Home / Self Care | Payer: Self-pay | Attending: Emergency Medicine | Admitting: Emergency Medicine

## 2017-09-25 DIAGNOSIS — J020 Streptococcal pharyngitis: Secondary | ICD-10-CM | POA: Insufficient documentation

## 2017-09-25 DIAGNOSIS — Z79899 Other long term (current) drug therapy: Secondary | ICD-10-CM | POA: Insufficient documentation

## 2017-09-25 LAB — POCT RAPID STREP A: STREPTOCOCCUS, GROUP A SCREEN (DIRECT): POSITIVE — AB

## 2017-09-25 MED ORDER — IBUPROFEN 800 MG PO TABS: 800.0000 mg | ORAL_TABLET | Freq: Three times a day (TID) | ORAL | 0 refills | Status: DC | PRN

## 2017-09-25 MED ORDER — IBUPROFEN 800 MG PO TABS
800.0000 mg | ORAL_TABLET | Freq: Once | ORAL | Status: AC
  Administered 2017-09-25: 800 mg via ORAL
  Filled 2017-09-25: qty 1

## 2017-09-25 MED ORDER — PENICILLIN G BENZATHINE 1200000 UNIT/2ML IM SUSP
1.2000 10*6.[IU] | Freq: Once | INTRAMUSCULAR | Status: AC
  Administered 2017-09-25: 1.2 10*6.[IU] via INTRAMUSCULAR
  Filled 2017-09-25: qty 2

## 2017-09-25 NOTE — ED Notes (Signed)

## 2017-09-25 NOTE — ED Provider Notes (Signed)
[facility] EMERGENCY DEPARTMENT Provider Note   CSN: [id number] Arrival date & time: 09/25/17  1903     History   Chief Complaint Chief Complaint  Patient presents with  . Fever  . Sore Throat  . Generalized Body Aches    HPI Debbie Jackson is a 31 y.o. female presents to the emergency department for evaluation of sore throat, body ache, headaches.  Symptoms have been present for 48 hours.  Today she developed a fever of 101.  She has not taken any antipyretic medications.  She is tolerating p.o. well.  Sore throat pain is 8 out of 10.  She denies any chest pain, shortness of breath, abdominal pain.  No rashes.  HPI  Past Medical History:  Diagnosis Date  . Lupus (systemic lupus erythematosus) (HCC)   . Renal disorder    since childhood    Patient Active Problem List   Diagnosis Date Noted  . Normal labor 06/11/2015  . GBS bacteriuria 05/05/2015    Past Surgical History:  Procedure Laterality Date  . ECTOPIC PREGNANCY SURGERY     right tube removed after burst tubal pregnancy  . KIDNEY SURGERY      OB History    Gravida Para Term Preterm AB Living   6 3 3   3 1    SAB TAB Ectopic Multiple Live Births   2 0 1 0 1       Home Medications    Prior to Admission medications   Medication Sig Start Date End Date Taking? Authorizing Provider  ibuprofen (ADVIL,MOTRIN) 800 MG tablet Take 1 tablet (800 mg total) by mouth every 8 (eight) hours as needed. 09/25/17   Evon SlackGaines, Thomas C, PA-C  NIKKI 3-0.02 MG tablet Take 1 tablet by mouth daily. 07/16/17   [provider]    Family History Family History  Problem Relation Age of Onset  . Hypertension Mother   . Hypertension Father   . Alcohol abuse Neg Hx   . Arthritis Neg Hx   . Asthma Neg Hx   . Birth defects Neg Hx   . Cancer Neg Hx   . COPD Neg Hx   . Depression Neg Hx   . Diabetes Neg Hx   . Drug abuse Neg Hx   . Early death Neg Hx   . Hearing loss Neg Hx   . Heart disease  Neg Hx   . Hyperlipidemia Neg Hx   . Kidney disease Neg Hx   . Learning disabilities Neg Hx   . Mental illness Neg Hx   . Mental retardation Neg Hx   . Miscarriages / Stillbirths Neg Hx   . Stroke Neg Hx   . Vision loss Neg Hx   . Varicose Veins Neg Hx     Social History Social History  Substance Use Topics  . Smoking status: Never Smoker  . Smokeless tobacco: Never Used  . Alcohol use No     Allergies   Patient has no known allergies.   Review of Systems Review of Systems  Constitutional: Positive for chills and fever.  HENT: Positive for sore throat. Negative for rhinorrhea, trouble swallowing and voice change.   Respiratory: Negative for cough and shortness of breath.   Cardiovascular: Negative for chest pain.  Gastrointestinal: Negative for abdominal pain.  Genitourinary: Negative for difficulty urinating, dysuria and urgency.  Musculoskeletal: Negative for back pain and myalgias.  Skin: Negative for rash.  Neurological: Negative for dizziness and headaches.  Physical Exam Updated Vital Signs BP 125/69 (BP Location: Left Arm)   Pulse (!) 118   Temp 99.5 F (37.5 C) (Oral)   Resp 20   Ht 5\' 6"  (1.676 m)   Wt 65.8 kg (145 lb)   LMP 09/14/2017 (Exact Date)   SpO2 100%   BMI 23.40 kg/m   Physical Exam  Constitutional: She is oriented to person, place, and time. She appears well-developed and well-nourished. No distress.  HENT:  Head: Normocephalic and atraumatic.  Right Ear: External ear normal.  Left Ear: External ear normal.  Mouth/Throat: Oropharynx is clear and moist. No oropharyngeal exudate.  Positive pharyngeal erythema with no swelling or exudates.  Uvula is midline.  Eyes: Pupils are equal, round, and reactive to light. EOM are normal. Right eye exhibits no discharge. Left eye exhibits no discharge.  Neck: Normal range of motion. Neck supple.  Cardiovascular: Normal rate, regular rhythm and intact distal pulses.   Pulmonary/Chest: Effort  normal and breath sounds normal. No respiratory distress. She exhibits no tenderness.  Abdominal: Soft. She exhibits no distension. There is no tenderness.  Musculoskeletal: Normal range of motion. She exhibits no edema.  Lymphadenopathy:    She has cervical adenopathy (Tender anterior cervical lymphadenopathy).  Neurological: She is alert and oriented to person, place, and time. She has normal reflexes.  Skin: Skin is warm and dry.  Psychiatric: She has a normal mood and affect. Her behavior is normal. Thought content normal.     ED Treatments / Results  Labs (all labs ordered are listed, but only abnormal results are displayed) Labs Reviewed  POCT RAPID STREP A - Abnormal; Notable for the following:       Result Value   Streptococcus, Group A Screen (Direct) POSITIVE (*)    All other components within normal limits    EKG  EKG Interpretation None       Radiology No results found.  Procedures Procedures (including critical care time)  Medications Ordered in ED Medications  ibuprofen (ADVIL,MOTRIN) tablet 800 mg (not administered)  penicillin g benzathine (BICILLIN LA) 1200000 UNIT/2ML injection 1.2 Million Units (not administered)     Initial Impression / Assessment and Plan / ED Course  I have reviewed the triage vital signs and the nursing notes.  Pertinent labs & imaging results that were available during my care of the patient were reviewed by me and considered in my medical decision making (see chart for details).     31 year old female with positive strep pharyngitis.  She is treated with penicillin G 1,200,000 units IM.  Ibuprofen and Tylenol as needed for body aches, fevers.  She will increase fluids.  She is educated on signs and symptoms return to the ED for.  Final Clinical Impressions(s) / ED Diagnoses   Final diagnoses:  Strep pharyngitis    New Prescriptions New Prescriptions   IBUPROFEN (ADVIL,MOTRIN) 800 MG TABLET    Take 1 tablet (800 mg  total) by mouth every 8 (eight) hours as needed.     Evon Slack, PA-C 09/25/17 2000    Merrily Brittle, MD 09/25/17 2231

## 2017-09-25 NOTE — Discharge Instructions (Signed)
Please alternate Tylenol and ibuprofen as needed for sore throat pain, body aches and fevers.  Make sure you are drinking lots of fluids.  Return to the emergency department for any difficulty swallowing, fevers above 102.1 then or not going down with Tylenol or ibuprofen, worsening symptoms or urgent changes in your health.

## 2017-09-25 NOTE — ED Triage Notes (Signed)
Patient reports having sore throat and fever since Sunday today with body aches.

## 2017-09-25 NOTE — ED Notes (Signed)
Pt reports Sunday her throat began to hurt and had "soreness to body and fever." Pt states she can feel drainage going down her throat. Pt states pain and diff swallowing, hx of strep. 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 Pt denies seeing white patches on throat. Pt A&O and in NAD at this time.

## 2017-11-29 ENCOUNTER — Other Ambulatory Visit: Payer: Self-pay

## 2017-11-29 ENCOUNTER — Emergency Department
Admission: EM | Admit: 2017-11-29 | Discharge: 2017-11-29 | Disposition: A | Discharge status: Home / Self Care | Payer: Self-pay | Attending: Emergency Medicine | Admitting: Emergency Medicine

## 2017-11-29 ENCOUNTER — Encounter: Payer: Self-pay | Admitting: Emergency Medicine

## 2017-11-29 DIAGNOSIS — J039 Acute tonsillitis, unspecified: Secondary | ICD-10-CM | POA: Insufficient documentation

## 2017-11-29 DIAGNOSIS — Z79899 Other long term (current) drug therapy: Secondary | ICD-10-CM | POA: Insufficient documentation

## 2017-11-29 LAB — INFLUENZA PANEL BY PCR (TYPE A & B)
Influenza A By PCR: NEGATIVE
Influenza B By PCR: NEGATIVE

## 2017-11-29 LAB — GROUP A STREP BY PCR: GROUP A STREP BY PCR: NOT DETECTED

## 2017-11-29 MED ORDER — AMOXICILLIN 500 MG PO CAPS: 500.0000 mg | ORAL_CAPSULE | Freq: Three times a day (TID) | ORAL | 0 refills | Status: DC

## 2017-11-29 MED ORDER — ACETAMINOPHEN 500 MG PO TABS
1000.0000 mg | ORAL_TABLET | Freq: Once | ORAL | Status: AC
  Administered 2017-11-29: 1000 mg via ORAL

## 2017-11-29 MED ORDER — LIDOCAINE VISCOUS 2 % MT SOLN: 10.0000 mL | OROMUCOSAL | 0 refills | Status: DC | PRN

## 2017-11-29 MED ORDER — ACETAMINOPHEN 500 MG PO TABS
ORAL_TABLET | ORAL | Status: AC
  Filled 2017-11-29: qty 2

## 2017-11-29 NOTE — ED Notes (Signed)

## 2017-11-29 NOTE — ED Provider Notes (Signed)
[facility] Emergency Department Provider Note  ____________________________________________  Time seen: Approximately 8:42 PM  I have reviewed the triage vital signs and the nursing notes.   HISTORY  Chief Complaint Cough and Generalized Body Aches    HPI Debbie Jackson is a 32 y.o. female that presents to the emergency department for evaluation of headaches, chills, body aches, sore throat for 2 days.  Patient had a cough for the last 2 weeks but this has resolved.  Family members have all had colds.  She does not smoke.  She denies shortness of breath, chest pain, nausea, vomiting, abdominal pain, diarrhea, constipation.   Past Medical History:  Diagnosis Date  . Lupus (systemic lupus erythematosus) (HCC)   . Renal disorder    since childhood    Patient Active Problem List   Diagnosis Date Noted  . Normal labor 06/11/2015  . GBS bacteriuria 05/05/2015    Past Surgical History:  Procedure Laterality Date  . ECTOPIC PREGNANCY SURGERY     right tube removed after burst tubal pregnancy  . KIDNEY SURGERY      Prior to Admission medications   Medication Sig Start Date End Date Taking? Authorizing Provider  amoxicillin (AMOXIL) 500 MG capsule Take 1 capsule (500 mg total) by mouth 3 (three) times daily. 11/29/17   Enid DerryWagner, Raymone Pembroke, PA-C  ibuprofen (ADVIL,MOTRIN) 800 MG tablet Take 1 tablet (800 mg total) by mouth every 8 (eight) hours as needed. 09/25/17   Evon SlackGaines, Thomas C, PA-C  lidocaine (XYLOCAINE) 2 % solution Use as directed 10 mLs in the mouth or throat as needed for mouth pain. 11/29/17   Enid DerryWagner, Kajuana Shareef, PA-C  NIKKI 3-0.02 MG tablet Take 1 tablet by mouth daily. 07/16/17   [provider]    Allergies Patient has no known allergies.  Family History  Problem Relation Age of Onset  . Hypertension Mother   . Hypertension Father   . Alcohol abuse Neg Hx   . Arthritis Neg Hx   . Asthma Neg Hx   . Birth defects Neg Hx   . Cancer Neg Hx    . COPD Neg Hx   . Depression Neg Hx   . Diabetes Neg Hx   . Drug abuse Neg Hx   . Early death Neg Hx   . Hearing loss Neg Hx   . Heart disease Neg Hx   . Hyperlipidemia Neg Hx   . Kidney disease Neg Hx   . Learning disabilities Neg Hx   . Mental illness Neg Hx   . Mental retardation Neg Hx   . Miscarriages / Stillbirths Neg Hx   . Stroke Neg Hx   . Vision loss Neg Hx   . Varicose Veins Neg Hx     Social History Social History   Tobacco Use  . Smoking status: Never Smoker  . Smokeless tobacco: Never Used  Substance Use Topics  . Alcohol use: No  . Drug use: No     Review of Systems  Eyes: No visual changes. No discharge. ENT: Negative for congestion and rhinorrhea. Cardiovascular: No chest pain. Respiratory: No SOB. Gastrointestinal: No abdominal pain.  No nausea, no vomiting.  No diarrhea.  No constipation. Musculoskeletal: Positive for body aches. Skin: Negative for rash, abrasions, lacerations, ecchymosis. Neurological: Positive for headache.   ____________________________________________   PHYSICAL EXAM:  VITAL SIGNS: ED Triage Vitals  Enc Vitals Group     BP 11/29/17 1917 140/87     Pulse Rate 11/29/17 1917 (!) 109  Resp 11/29/17 1917 18     Temp 11/29/17 1917 99.4 F (37.4 C)     Temp Source 11/29/17 1917 Oral     SpO2 11/29/17 1917 100 %     Weight 11/29/17 1916 150 lb (68 kg)     Height 11/29/17 1916 5\' 6"  (1.676 m)     Head Circumference --      Peak Flow --      Pain Score 11/29/17 1916 7     Pain Loc --      Pain Edu? --      Excl. in GC? --      Constitutional: Alert and oriented. Well appearing and in no acute distress. Eyes: Conjunctivae are normal. PERRL. EOMI. No discharge. Head: Atraumatic. ENT: No frontal and maxillary sinus tenderness.      Ears: Tympanic membranes pearly gray with good landmarks. No discharge.      Nose: No congestion/rhinnorhea.      Mouth/Throat: Mucous membranes are moist. Oropharynx erythematous.  Tonsils not enlarged. Exudates to bilateral tonsils. Uvula midline. Neck: No stridor.   Hematological/Lymphatic/Immunilogical: No cervical lymphadenopathy. Cardiovascular: Normal rate, regular rhythm.  Good peripheral circulation. Respiratory: Normal respiratory effort without tachypnea or retractions. Lungs CTAB. Good air entry to the bases with no decreased or absent breath sounds. Gastrointestinal: Bowel sounds 4 quadrants. Soft and nontender to palpation. No guarding or rigidity. No palpable masses. No distention. Musculoskeletal: Full range of motion to all extremities. No gross deformities appreciated. Neurologic:  Normal speech and language. No gross focal neurologic deficits are appreciated.  Skin:  Skin is warm, dry and intact. No rash noted.  ____________________________________________   LABS (all labs ordered are listed, but only abnormal results are displayed)  Labs Reviewed  GROUP A STREP BY PCR  INFLUENZA PANEL BY PCR (TYPE A & B)   ____________________________________________  EKG   ____________________________________________  RADIOLOGY   No results found.  ____________________________________________    PROCEDURES  Procedure(s) performed:    Procedures    Medications  acetaminophen (TYLENOL) tablet 1,000 mg (1,000 mg Oral Given 11/29/17 2220)     ____________________________________________   INITIAL IMPRESSION / ASSESSMENT AND PLAN / ED COURSE  Pertinent labs & imaging results that were available during my care of the patient were reviewed by me and considered in my medical decision making (see chart for details).  Review of the [place] CSRS was performed in accordance of the [facility] prior to dispensing any controlled drugs.   Patient's diagnosis is consistent with tonsillitis. Vital signs and exam are reassuring.  Strep and influenza are negative.  Patient appears well and is staying well hydrated. Patient should alternate tylenol and ibuprofen  for fever. Patient feels comfortable going home. Patient will be discharged home with prescriptions for amoxicillin and viscous lidocaine. Patient is to follow up with PCP as needed or otherwise directed. Patient is given ED precautions to return to the ED for any worsening or new symptoms.     ____________________________________________  FINAL CLINICAL IMPRESSION(S) / ED DIAGNOSES  Final diagnoses:  Tonsillitis      NEW MEDICATIONS STARTED DURING THIS VISIT:  ED Discharge Orders        Ordered    amoxicillin (AMOXIL) 500 MG capsule  3 times daily     11/29/17 2233    lidocaine (XYLOCAINE) 2 % solution  As needed     11/29/17 2233          This chart was dictated using voice recognition software/Dragon. Despite best efforts  to proofread, errors can occur which can change the meaning. Any change was purely unintentional.    Enid Derry, PA-C 11/29/17 2320    Minna Antis, MD 11/30/17 [phone]

## 2017-11-29 NOTE — ED Triage Notes (Signed)
Patient ambulatory to triage with steady gait, without difficulty or distress noted; pt reports body aches,  Congestion, nonprod cough, sore throat x 2 days

## 2017-12-27 DIAGNOSIS — H6983 Other specified disorders of Eustachian tube, bilateral: Secondary | ICD-10-CM | POA: Insufficient documentation

## 2019-01-09 DIAGNOSIS — Q625 Duplication of ureter: Secondary | ICD-10-CM | POA: Insufficient documentation

## 2019-01-21 DIAGNOSIS — E559 Vitamin D deficiency, unspecified: Secondary | ICD-10-CM | POA: Insufficient documentation

## 2019-02-13 ENCOUNTER — Other Ambulatory Visit: Payer: Self-pay

## 2019-02-13 ENCOUNTER — Emergency Department (HOSPITAL_COMMUNITY)
Admission: EM | Admit: 2019-02-13 | Discharge: 2019-02-13 | Disposition: A | Discharge status: Home / Self Care | Payer: Self-pay | Attending: Emergency Medicine | Admitting: Emergency Medicine

## 2019-02-13 ENCOUNTER — Emergency Department (HOSPITAL_COMMUNITY): Payer: Self-pay

## 2019-02-13 ENCOUNTER — Encounter (HOSPITAL_COMMUNITY): Payer: Self-pay | Admitting: Emergency Medicine

## 2019-02-13 DIAGNOSIS — R200 Anesthesia of skin: Secondary | ICD-10-CM | POA: Insufficient documentation

## 2019-02-13 DIAGNOSIS — M3210 Systemic lupus erythematosus, organ or system involvement unspecified: Secondary | ICD-10-CM | POA: Insufficient documentation

## 2019-02-13 DIAGNOSIS — I639 Cerebral infarction, unspecified: Secondary | ICD-10-CM

## 2019-02-13 HISTORY — DX: Migraine, unspecified, not intractable, without status migrainosus: G43.909

## 2019-02-13 LAB — COMPREHENSIVE METABOLIC PANEL
ALT: 14 U/L (ref 0–44)
AST: 17 U/L (ref 15–41)
Albumin: 3.3 g/dL — ABNORMAL LOW (ref 3.5–5.0)
Alkaline Phosphatase: 91 U/L (ref 38–126)
Anion gap: 9 (ref 5–15)
BUN: 9 mg/dL (ref 6–20)
CO2: 23 mmol/L (ref 22–32)
Calcium: 8.7 mg/dL — ABNORMAL LOW (ref 8.9–10.3)
Chloride: 105 mmol/L (ref 98–111)
Creatinine, Ser: 0.72 mg/dL (ref 0.44–1.00)
GFR calc Af Amer: >60 mL/min (ref >60)
GFR calc non Af Amer: >60 mL/min (ref >60)
Glucose, Bld: 139 mg/dL — ABNORMAL HIGH (ref 70–99)
POTASSIUM: 3.6 mmol/L (ref 3.5–5.1)
SODIUM: 137 mmol/L (ref 135–145)
Total Bilirubin: 0.5 mg/dL (ref 0.3–1.2)
Total Protein: 6.5 g/dL (ref 6.5–8.1)

## 2019-02-13 LAB — DIFFERENTIAL
ABS IMMATURE GRANULOCYTES: 0.03 10*3/uL (ref 0.00–0.07)
Basophils Absolute: 0.1 10*3/uL (ref 0.0–0.1)
Basophils Relative: 1 %
Eosinophils Absolute: 0.2 10*3/uL (ref 0.0–0.5)
Eosinophils Relative: 2 %
Immature Granulocytes: 0 %
Lymphocytes Relative: 38 %
Lymphs Abs: 3.1 10*3/uL (ref 0.7–4.0)
Monocytes Absolute: 0.8 10*3/uL (ref 0.1–1.0)
Monocytes Relative: 9 %
Neutro Abs: 4.1 10*3/uL (ref 1.7–7.7)
Neutrophils Relative %: 50 %

## 2019-02-13 LAB — CBG MONITORING, ED: Glucose-Capillary: 129 mg/dL — ABNORMAL HIGH (ref 70–99)

## 2019-02-13 LAB — I-STAT BETA HCG BLOOD, ED (MC, WL, AP ONLY): I-stat hCG, quantitative: <5 m[IU]/mL (ref <5)

## 2019-02-13 LAB — CBC
HCT: 39.5 % (ref 36.0–46.0)
Hemoglobin: 13.2 g/dL (ref 12.0–15.0)
MCH: 29.1 pg (ref 26.0–34.0)
MCHC: 33.4 g/dL (ref 30.0–36.0)
MCV: 87.2 fL (ref 80.0–100.0)
NRBC: 0 % (ref 0.0–0.2)
Platelets: 269 10*3/uL (ref 150–400)
RBC: 4.53 MIL/uL (ref 3.87–5.11)
RDW: 12.3 % (ref 11.5–15.5)
WBC: 8.3 10*3/uL (ref 4.0–10.5)

## 2019-02-13 LAB — APTT: aPTT: 27 seconds (ref 24–36)

## 2019-02-13 LAB — I-STAT CREATININE, ED: Creatinine, Ser: 0.7 mg/dL (ref 0.44–1.00)

## 2019-02-13 LAB — PROTIME-INR
INR: 1 (ref 0.8–1.2)
Prothrombin Time: 13.3 seconds (ref 11.4–15.2)

## 2019-02-13 MED ORDER — IOHEXOL 350 MG/ML SOLN
75.0000 mL | Freq: Once | INTRAVENOUS | Status: AC | PRN
  Administered 2019-02-13: 75 mL via INTRAVENOUS

## 2019-02-13 MED ORDER — SODIUM CHLORIDE 0.9 % IV BOLUS
1000.0000 mL | Freq: Once | INTRAVENOUS | Status: AC
  Administered 2019-02-13: 1000 mL via INTRAVENOUS

## 2019-02-13 MED ORDER — DIPHENHYDRAMINE HCL 50 MG/ML IJ SOLN
25.0000 mg | Freq: Once | INTRAMUSCULAR | Status: AC
  Administered 2019-02-13: 25 mg via INTRAVENOUS
  Filled 2019-02-13: qty 1

## 2019-02-13 MED ORDER — SODIUM CHLORIDE 0.9% FLUSH: 3.0000 mL | Freq: Once | INTRAVENOUS | Status: DC

## 2019-02-13 MED ORDER — ONDANSETRON HCL 4 MG/2ML IJ SOLN
4.0000 mg | Freq: Once | INTRAMUSCULAR | Status: AC
  Administered 2019-02-13: 4 mg via INTRAVENOUS
  Filled 2019-02-13: qty 2

## 2019-02-13 MED ORDER — PROCHLORPERAZINE EDISYLATE 10 MG/2ML IJ SOLN
10.0000 mg | Freq: Once | INTRAMUSCULAR | Status: AC
  Administered 2019-02-13: 10 mg via INTRAVENOUS
  Filled 2019-02-13: qty 2

## 2019-02-13 NOTE — ED Triage Notes (Addendum)
Pt reports right sided body numbness that the pt reports she woke up with @ 0600 this date. Pt denies any other neuro symptoms.

## 2019-02-13 NOTE — ED Notes (Signed)
Patient verbalizes understanding of discharge instructions. Opportunity for questioning and answers were provided. Armband removed by staff, pt discharged from ED.  

## 2019-02-13 NOTE — ED Provider Notes (Signed)
[facility] Emergency Department Provider Note MRN:  [id number]  Arrival date & time: 02/13/19     Chief Complaint   Numbness (Right sided)   History of Present Illness   Debbie Jackson is a 33 y.o. year-old female with a history of lupus, migraines presenting to the ED with chief complaint of numbness.  Patient went to bed at 11:30 PM feeling her normal self.  Woke up and noticed that she was experiencing some sensation deficits to her right side.  Decreased sensation to right side of the face, right arm, right leg.  Denies headache or vision change, no slurred speech or confusion, no neck pain, no trouble swallowing, no chest pain, no shortness of breath, no abdominal pain, no recent fever or cold-like symptoms, no dysuria.  Review of Systems  A complete 10 system review of systems was obtained and all systems are negative except as noted in the HPI and PMH.   Patient's Health History    Past Medical History:  Diagnosis Date  . Migraine   . Renal disorder    since childhood    Past Surgical History:  Procedure Laterality Date  . ECTOPIC PREGNANCY SURGERY     right tube removed after burst tubal pregnancy  . KIDNEY SURGERY      Family History  Problem Relation Age of Onset  . Hypertension Mother   . Hypertension Father   . Alcohol abuse Neg Hx   . Arthritis Neg Hx   . Asthma Neg Hx   . Birth defects Neg Hx   . Cancer Neg Hx   . COPD Neg Hx   . Depression Neg Hx   . Diabetes Neg Hx   . Drug abuse Neg Hx   . Early death Neg Hx   . Hearing loss Neg Hx   . Heart disease Neg Hx   . Hyperlipidemia Neg Hx   . Kidney disease Neg Hx   . Learning disabilities Neg Hx   . Mental illness Neg Hx   . Mental retardation Neg Hx   . Miscarriages / Stillbirths Neg Hx   . Stroke Neg Hx   . Vision loss Neg Hx   . Varicose Veins Neg Hx     Social History   Socioeconomic History  . Marital status: Single    Spouse name: Not on file  . Number of children: Not  on file  . Years of education: Not on file  . Highest education level: Not on file  Occupational History  . Not on file  Social Needs  . Financial resource strain: Not on file  . Food insecurity:    Worry: Not on file    Inability: Not on file  . Transportation needs:    Medical: Not on file    Non-medical: Not on file  Tobacco Use  . Smoking status: Never Smoker  . Smokeless tobacco: Never Used  Substance and Sexual Activity  . Alcohol use: No  . Drug use: No  . Sexual activity: Yes    Birth control/protection: None  Lifestyle  . Physical activity:    Days per week: Not on file    Minutes per session: Not on file  . Stress: Not on file  Relationships  . Social connections:    Talks on phone: Not on file    Gets together: Not on file    Attends religious service: Not on file    Active member of club or organization: Not on file  Attends meetings of clubs or organizations: Not on file    Relationship status: Not on file  . Intimate partner violence:    Fear of current or ex partner: Not on file    Emotionally abused: Not on file    Physically abused: Not on file    Forced sexual activity: Not on file  Other Topics Concern  . Not on file  Social History Narrative  . Not on file     Physical Exam  Vital Signs and Nursing Notes reviewed Vitals:   02/13/19 0845 02/13/19 1117  BP: 119/84 121/78  Pulse: 87 75  Resp: 19 16  SpO2: 100% 99%    CONSTITUTIONAL: Well-appearing, NAD NEURO:  Alert and oriented x 3, normal and symmetric strength, normal coordination, normal extraocular movements, no visual field cuts, no aphasia, no neglect, no slurred speech.  Subjective right facial, arm, leg decrease sensation EYES:  eyes equal and reactive ENT/NECK:  no LAD, no JVD CARDIO: Regular rate, well-perfused, normal S1 and S2 PULM:  CTAB no wheezing or rhonchi GI/GU:  normal bowel sounds, non-distended, non-tender MSK/SPINE:  No gross deformities, no edema SKIN:  no rash,  atraumatic PSYCH:  Appropriate speech and behavior  Diagnostic and Interventional Summary    EKG Interpretation  Date/Time:  Wednesday February 13 2019 07:28:00 EDT Ventricular Rate:  86 PR Interval:    QRS Duration: 97 QT Interval:  379 QTC Calculation: 454 R Axis:   34 Text Interpretation:  Sinus rhythm Confirmed by Kennis Carina [phone]) on 02/13/2019 11:28:11 AM      Labs Reviewed  COMPREHENSIVE METABOLIC PANEL - Abnormal; Notable for the following components:      Result Value   Glucose, Bld 139 (*)    Calcium 8.7 (*)    Albumin 3.3 (*)    All other components within normal limits  CBG MONITORING, ED - Abnormal; Notable for the following components:   Glucose-Capillary 129 (*)    All other components within normal limits  PROTIME-INR  APTT  CBC  DIFFERENTIAL  I-STAT CREATININE, ED  I-STAT BETA HCG BLOOD, ED ([facility], [facility], [facility] ONLY)    MR BRAIN WO CONTRAST  Final Result    CT Angio Head W or Wo Contrast  Final Result    CT Angio Neck W and/or Wo Contrast  Final Result    CT HEAD CODE STROKE WO CONTRAST  Final Result      Medications  sodium chloride flush (NS) 0.9 % injection 3 mL (has no administration in time range)  iohexol (OMNIPAQUE) 350 MG/ML injection 75 mL (75 mLs Intravenous Contrast Given 02/13/19 0831)  diphenhydrAMINE (BENADRYL) injection 25 mg (25 mg Intravenous Given 02/13/19 0925)  prochlorperazine (COMPAZINE) injection 10 mg (10 mg Intravenous Given 02/13/19 0924)  sodium chloride 0.9 % bolus 1,000 mL (1,000 mLs Intravenous New Bag/Given 02/13/19 0925)  ondansetron (ZOFRAN) injection 4 mg (4 mg Intravenous Given 02/13/19 6045)     Procedures Critical Care  ED Course and Medical Decision Making  I have reviewed the triage vital signs and the nursing notes.  Pertinent labs & imaging results that were available during my care of the patient were reviewed by me and considered in my medical decision making (see below for details).  Concern for stroke  versus complex migraine in this 33 year old female with history of lupus, migraines.  Patient woke up with the symptoms, last known normal 8 hours ago, Zenaida Niece negative, therefore not a code stroke candidate but will quickly obtain CT head imaging  and consult neurology.  Clinical Course as of Feb 13 1127  Wed Feb 13, 2019  0809 Spoke with Dr. Amada Jupiter of neurology who agrees with this work-up for acute stroke, agrees that she was not a candidate for TPA or code stroke initiation.  Given her working diagnosis of lupus, should follow-up CT head with CTA imaging to exclude vasculitis.  If unremarkable, MRI.   [MB]    Clinical Course User Index [MB] Sabas Sous, MD    MRI is also normal.  Patient's symptoms are resolved after migraine cocktail.  Normal sensation bilaterally, completely normal neurological exam.  This is consistent with migraine, atypical given lack of headache or other symptoms.  Appropriate for discharge.  After the discussed management above, the patient was determined to be safe for discharge.  The patient was in agreement with this plan and all questions regarding their care were answered.  ED return precautions were discussed and the patient will return to the ED with any significant worsening of condition.  Elmer Sow. Pilar Plate, MD [facility] Emergency Medicine [facility] [email or web address]  Final Clinical Impressions(s) / ED Diagnoses     ICD-10-CM   1. Numbness R20.0     ED Discharge Orders    None         Sabas Sous, MD 02/13/19 1129

## 2019-02-13 NOTE — ED Notes (Signed)
Patient transported to CT 

## 2019-02-13 NOTE — Discharge Instructions (Addendum)
You were evaluated in the Emergency Department and after careful evaluation, we did not find any emergent condition requiring admission or further testing in the hospital.  Your CTs and MRIs today were normal and very reassuring.  We believe that your symptoms are related to a migraine.  We are encouraged that the symptoms have resolved after migraine medications.  Please follow-up with your regular doctors.  Please return to the Emergency Department if you experience any worsening of your condition.  We encourage you to follow up with a primary care provider.  Thank you for allowing [place] to be a part of your care.

## 2019-02-13 NOTE — ED Notes (Signed)
Sugar 129

## 2019-02-13 NOTE — Consult Note (Addendum)
NEURO HOSPITALIST CONSULT NOTE   Requestig physician: Dr. Pilar Plate   Reason for Consult: right sided numbness   History obtained from:  Patient and Chart    HPI:                                                                                                                                          Debbie Jackson is an 33 y.o. female with history of migraines, myalgias, MDD, and anxiety presenting to [facility] with right sided numbness which she noticed on waking up this morning. Last known normal was 11:30pm last night.   Patient states on waking up this morning she noticed numbness in her right face initially which was followed by sensation of numbness in her arm and leg. She is noticing some twitching in the muscles of the right leg only and notes pins/needles sensation in her RUE when BP cuff is inflated which she has never before experienced with BP checks. She denies any weakness, dysarthria, dysphagia, vision or hearing changes, recent trauma. She denies recent infectious symptoms including cough, abd pain, nausea, vomiting, diarrhea, urinary symptoms. She denies chest pain, shortness of breath, rash.  She endorses a history of tension type and one-sided headaches at most with associated symptoms of mild photosensitivity; sometimes aching and sometimes throbbing in nature. She has not had any recent headaches.   Other than Bell's palsy as a child, she denies personal or family history of neurologic disease or autoimmune disease.  Patient denies EtOH use, tobacco use, vaping, or illicit drug use.  Patient has had extensive rheumatological workup in the past (including anticardiolipin and lupus anticoagulant) for possible SLE (at [facility] and [facility]) due to a mildly positive speckled ANA, and had a recent E-consult by a 3rd rheumatologist at [facility] all interpreting her prior workup as not consistent with a rheumatological disease or cause of her myalgias. At one point MS was on the  differential and she received MRI brain and cervical spine which was negative.  On arrival to the ED, CBG was 129. CBC was unremarkable. PT/APTT wnl. CMP was remarkable for glucose of 139, and slightly low albumin at 3.3. CT head was negative. CTA of head and neck was unremarkable.  Past Medical History:  Diagnosis Date  . Migraine   . Renal disorder    since childhood    Past Surgical History:  Procedure Laterality Date  . ECTOPIC PREGNANCY SURGERY     right tube removed after burst tubal pregnancy  . KIDNEY SURGERY      Family History  Problem Relation Age of Onset  . Hypertension Mother   . Hypertension Father   . Alcohol abuse Neg Hx   . Arthritis Neg Hx   . Asthma Neg Hx   . Birth defects Neg  Hx   . Cancer Neg Hx   . COPD Neg Hx   . Depression Neg Hx   . Diabetes Neg Hx   . Drug abuse Neg Hx   . Early death Neg Hx   . Hearing loss Neg Hx   . Heart disease Neg Hx   . Hyperlipidemia Neg Hx   . Kidney disease Neg Hx   . Learning disabilities Neg Hx   . Mental illness Neg Hx   . Mental retardation Neg Hx   . Miscarriages / Stillbirths Neg Hx   . Stroke Neg Hx   . Vision loss Neg Hx   . Varicose Veins Neg Hx      Social History:  reports that she has never smoked. She has never used smokeless tobacco. She reports that she does not drink alcohol or use drugs.  No Known Allergies  MEDICATIONS:                                                                                                                     Prior to Admission: (Not in a hospital admission)  ROS:                                                                                                                                       History obtained from the patient. Per HPI otherwise negative.   Blood pressure 136/83, pulse 86, resp. rate 18, height 5\' 5"  (1.651 m), weight 83.9 kg, last menstrual period 01/17/2019, SpO2 100 %, unknown if currently breastfeeding.   General Examination:                                                                                                        Physical Exam  HEENT-  Normocephalic, no lesions, without obvious abnormality.  Normal external eye and conjunctiva.   Cardiovascular- S1-S2 audible, pulses palpable throughout   Lungs-no rhonchi or wheezing noted, no excessive working breathing.  Saturations within normal limits Abdomen- All  4 quadrants palpated and nontender Extremities- Warm, dry and intact Musculoskeletal-no joint tenderness, deformity or swelling Skin-warm and dry, no hyperpigmentation, vitiligo, or suspicious lesions  Neurological Examination Mental Status: Alert, oriented, thought content appropriate.  Speech fluent without evidence of aphasia. Cranial Nerves: II: Visual fields grossly normal III,IV, VI: ptosis not present, extra-ocular motions intact bilaterally pupils equal, round, reactive to light V,VII: smile symmetric, facial light touch sensation present bilaterally but subjectively decreased on right in V1-3 VIII: hearing normal bilaterally IX,X: soft palate rises symmetrically XI: bilateral shoulder shrug XII: midline tongue extension Motor: Right : Upper extremity   5/5    Lower extremity   5/5   Left:     Upper extremity   5/5  Lower extremity   5/5     Tone and bulk:normal tone throughout; no atrophy noted Sensory: light touch and cold temperature intact throughout, bilaterally but decreased on the right. No extinction present Deep Tendon Reflexes: 2+, brisk and symmetric throughout Cerebellar: normal finger-to-nose   Lab Results: Basic Metabolic Panel: Recent Labs  Lab 02/13/19 0747  CREATININE 0.70    CBC: Recent Labs  Lab 02/13/19 0730  WBC 8.3  NEUTROABS 4.1  HGB 13.2  HCT 39.5  MCV 87.2  PLT 269    Cardiac Enzymes: No results for input(s): CKTOTAL, CKMB, CKMBINDEX, TROPONINI in the last 168 hours.  Lipid Panel: No results for input(s): CHOL, TRIG, HDL, CHOLHDL, VLDL, LDLCALC in the  last 782 hours.  Imaging: Ct Head Code Stroke Wo Contrast  Result Date: 02/13/2019 CLINICAL DATA:  Code stroke.  Numbness or tingling, paresthesia EXAM: CT HEAD WITHOUT CONTRAST TECHNIQUE: Contiguous axial images were obtained from the base of the skull through the vertex without intravenous contrast. COMPARISON:  02/11/2009 FINDINGS: Brain: No evidence of acute infarction, hemorrhage, hydrocephalus, extra-axial collection or mass lesion/mass effect. Vascular: No hyperdense vessel or unexpected calcification. Skull: Normal. Negative for fracture or focal lesion. Sinuses/Orbits: Negative Other: Reportedly not an activated code stroke such that a page is not being sent to avoid confusion ASPECTS [facility] Stroke Program Early CT Score) - Ganglionic level infarction (caudate, lentiform nuclei, internal capsule, insula, M1-M3 cortex): 7 - Supraganglionic infarction (M4-M6 cortex): 3 Total score (0-10 with 10 being normal): 10 IMPRESSION: Negative head CT. Electronically Signed   By: Marnee Spring M.D.   On: 02/13/2019 07:53    Assessment:  Ms. Leichter is a 33 yo female with PMH of headache, chronic diffuse pain, MDD, and anxiety presenting to ED with right sided numbness. CT head and CTA negative. She has no risk factors for CVA or vasculitis that are apparent. Exam points to a possible thalamic or cerebral hemispheric lesion. Other etiology could be migraine aura.  Recommendations: --f/u MRI brain wo contrast; further evaluation based on these results  Nyra Market, MD PGY3

## 2019-02-13 NOTE — ED Notes (Signed)
Gold tops drawn and held in lab.

## 2019-09-24 ENCOUNTER — Other Ambulatory Visit: Payer: Self-pay

## 2019-09-24 DIAGNOSIS — Z20822 Contact with and (suspected) exposure to covid-19: Secondary | ICD-10-CM

## 2019-09-25 LAB — NOVEL CORONAVIRUS, NAA: SARS-CoV-2, NAA: NOT DETECTED

## 2019-10-28 ENCOUNTER — Other Ambulatory Visit: Payer: Self-pay

## 2019-10-30 ENCOUNTER — Encounter (HOSPITAL_COMMUNITY): Payer: Self-pay | Admitting: Emergency Medicine

## 2019-10-30 ENCOUNTER — Emergency Department (HOSPITAL_COMMUNITY)
Admission: EM | Admit: 2019-10-30 | Discharge: 2019-10-30 | Disposition: A | Discharge status: Home / Self Care | Payer: Medicaid Other | Attending: Emergency Medicine | Admitting: Emergency Medicine

## 2019-10-30 ENCOUNTER — Other Ambulatory Visit: Payer: Self-pay

## 2019-10-30 DIAGNOSIS — Z20828 Contact with and (suspected) exposure to other viral communicable diseases: Secondary | ICD-10-CM | POA: Insufficient documentation

## 2019-10-30 DIAGNOSIS — Z79899 Other long term (current) drug therapy: Secondary | ICD-10-CM | POA: Diagnosis not present

## 2019-10-30 DIAGNOSIS — J029 Acute pharyngitis, unspecified: Secondary | ICD-10-CM | POA: Diagnosis not present

## 2019-10-30 DIAGNOSIS — R070 Pain in throat: Secondary | ICD-10-CM | POA: Diagnosis present

## 2019-10-30 LAB — SARS CORONAVIRUS 2 (TAT 6-24 HRS): SARS Coronavirus 2: NEGATIVE

## 2019-10-30 LAB — GROUP A STREP BY PCR: Group A Strep by PCR: NOT DETECTED

## 2019-10-30 MED ORDER — IBUPROFEN 400 MG PO TABS
600.0000 mg | ORAL_TABLET | Freq: Once | ORAL | Status: AC
  Administered 2019-10-30: 600 mg via ORAL
  Filled 2019-10-30: qty 1

## 2019-10-30 NOTE — Discharge Instructions (Signed)
You most likely have a viral pharyngitis.  Your strep test was negative.  You may continue use I Profen Tylenol as needed for sore throat.  We are testing you for COVID-19. Even with a negative Covid test, you should self isolate for 10 days since the onset of illness.  We have written a work excuse for you.  If you develop any symptoms of worsening shortness of breath, chest pain, fevers that do not improve with Tylenol or Profen or any other worrisome symptoms please go to your doctor or come to the emergency department.

## 2019-10-30 NOTE — ED Provider Notes (Signed)
[facility] EMERGENCY DEPARTMENT Provider Note   CSN: [id number] Arrival date & time: 10/30/19  0802     History   Chief Complaint Chief Complaint  Patient presents with  . Sore Throat  . Headache    HPI Debbie Jackson is a 33 y.o. female.     Patient presents with 4 days of sore throat.  Also had 2 days of runny nose and cough.  Denies any shortness of breath, fevers, abdominal pain, nausea, vomiting, UTI symptoms.  Patient says that she has not had any sick contacts, but she does work as a Education officer, environmental.  She is also had headaches.  She has been taking ibuprofen as needed for throat pain.  She did wake up this morning and had difficulty swallowing due to pain.     Past Medical History:  Diagnosis Date  . Migraine   . Renal disorder    since childhood    Patient Active Problem List   Diagnosis Date Noted  . Normal labor 06/11/2015  . GBS bacteriuria 05/05/2015    Past Surgical History:  Procedure Laterality Date  . ECTOPIC PREGNANCY SURGERY     right tube removed after burst tubal pregnancy  . KIDNEY SURGERY       OB History    Gravida  6   Para  3   Term  3   Preterm      AB  3   Living  1     SAB  2   TAB  0   Ectopic  1   Multiple  0   Live Births  1            Home Medications    Prior to Admission medications   Medication Sig Start Date End Date Taking? Authorizing Provider  amoxicillin (AMOXIL) 500 MG capsule Take 1 capsule (500 mg total) by mouth 3 (three) times daily. 11/29/17   Enid Derry, PA-C  ibuprofen (ADVIL,MOTRIN) 800 MG tablet Take 1 tablet (800 mg total) by mouth every 8 (eight) hours as needed. 09/25/17   Evon Slack, PA-C  lidocaine (XYLOCAINE) 2 % solution Use as directed 10 mLs in the mouth or throat as needed for mouth pain. 11/29/17   Enid Derry, PA-C  NIKKI 3-0.02 MG tablet Take 1 tablet by mouth daily. 07/16/17   [provider]    Family History Family History   Problem Relation Age of Onset  . Hypertension Mother   . Hypertension Father   . Alcohol abuse Neg Hx   . Arthritis Neg Hx   . Asthma Neg Hx   . Birth defects Neg Hx   . Cancer Neg Hx   . COPD Neg Hx   . Depression Neg Hx   . Diabetes Neg Hx   . Drug abuse Neg Hx   . Early death Neg Hx   . Hearing loss Neg Hx   . Heart disease Neg Hx   . Hyperlipidemia Neg Hx   . Kidney disease Neg Hx   . Learning disabilities Neg Hx   . Mental illness Neg Hx   . Mental retardation Neg Hx   . Miscarriages / Stillbirths Neg Hx   . Stroke Neg Hx   . Vision loss Neg Hx   . Varicose Veins Neg Hx     Social History Social History   Tobacco Use  . Smoking status: Never Smoker  . Smokeless tobacco: Never Used  Substance Use Topics  . Alcohol  use: No  . Drug use: No     Allergies   Patient has no known allergies.   Review of Systems Review of Systems As per HPI  Physical Exam Updated Vital Signs BP (!) 143/74 (BP Location: Left Arm)   Pulse 97   Temp 98.3 F (36.8 C) (Oral)   Resp 18   LMP 10/02/2019   SpO2 97%   Physical Exam Constitutional:      General: She is not in acute distress.    Appearance: She is well-developed.  HENT:     Head: Normocephalic and atraumatic.     Nose: Congestion present.     Mouth/Throat:     Mouth: Mucous membranes are moist.     Pharynx: Uvula midline. Posterior oropharyngeal erythema present.     Tonsils: No tonsillar exudate or tonsillar abscesses.  Eyes:     Conjunctiva/sclera: Conjunctivae normal.     Pupils: Pupils are equal, round, and reactive to light.  Neck:     Musculoskeletal: Normal range of motion.  Cardiovascular:     Rate and Rhythm: Normal rate and regular rhythm.  Pulmonary:     Effort: Pulmonary effort is normal.     Breath sounds: Normal breath sounds.  Abdominal:     General: Bowel sounds are normal.     Palpations: Abdomen is soft.  Lymphadenopathy:     Cervical: No cervical adenopathy.  Skin:    General:  Skin is warm and dry.     Capillary Refill: Capillary refill takes less than 2 seconds.  Neurological:     General: No focal deficit present.     Mental Status: She is alert and oriented to person, place, and time.  Psychiatric:        Mood and Affect: Mood normal.        Behavior: Behavior normal.      ED Treatments / Results  Labs (all labs ordered are listed, but only abnormal results are displayed) Labs Reviewed  GROUP A STREP BY PCR  SARS CORONAVIRUS 2 (TAT 6-24 HRS)    EKG None  Radiology No results found.  Procedures Procedures (including critical care time)  Medications Ordered in ED Medications  ibuprofen (ADVIL) tablet 600 mg (has no administration in time range)     Initial Impression / Assessment and Plan / ED Course  I have reviewed the triage vital signs and the nursing notes.  Pertinent labs & imaging results that were available during my care of the patient were reviewed by me and considered in my medical decision making (see chart for details).        Patient presenting with pharyngitis.  Rapid strep negative.  Covid pending.  May take OTC analgesics and I recommend supportive care.  Covid test collected, pending.  Recommend self quarantining per CDC guidelines.  Work note was provided.  Final Clinical Impressions(s) / ED Diagnoses   Final diagnoses:  Viral pharyngitis    ED Discharge Orders    None       Bonnita Hollow, MD 10/30/19 2836    Elnora Morrison, MD 10/30/19 [phone]

## 2019-10-30 NOTE — ED Triage Notes (Signed)
C/o sore throat, headache, and fatigue x 4 days.  Reports body aches yesterday that have resolved.

## 2019-11-14 ENCOUNTER — Other Ambulatory Visit: Payer: Self-pay

## 2019-11-14 DIAGNOSIS — Z20822 Contact with and (suspected) exposure to covid-19: Secondary | ICD-10-CM

## 2019-11-15 LAB — NOVEL CORONAVIRUS, NAA: SARS-CoV-2, NAA: NOT DETECTED

## 2020-04-11 ENCOUNTER — Other Ambulatory Visit: Payer: Self-pay

## 2020-04-11 DIAGNOSIS — Z79899 Other long term (current) drug therapy: Secondary | ICD-10-CM | POA: Diagnosis not present

## 2020-04-11 DIAGNOSIS — R0789 Other chest pain: Secondary | ICD-10-CM | POA: Diagnosis present

## 2020-04-12 ENCOUNTER — Emergency Department (HOSPITAL_COMMUNITY)
Admission: EM | Admit: 2020-04-12 | Discharge: 2020-04-12 | Disposition: A | Discharge status: Home / Self Care | Payer: Medicaid Other | Attending: Emergency Medicine | Admitting: Emergency Medicine

## 2020-04-12 ENCOUNTER — Other Ambulatory Visit: Payer: Self-pay

## 2020-04-12 ENCOUNTER — Emergency Department (HOSPITAL_COMMUNITY): Payer: Medicaid Other

## 2020-04-12 ENCOUNTER — Encounter (HOSPITAL_COMMUNITY): Payer: Self-pay | Admitting: *Deleted

## 2020-04-12 DIAGNOSIS — R079 Chest pain, unspecified: Secondary | ICD-10-CM

## 2020-04-12 LAB — CBC
HCT: 43.4 % (ref 36.0–46.0)
Hemoglobin: 14.8 g/dL (ref 12.0–15.0)
MCH: 30.2 pg (ref 26.0–34.0)
MCHC: 34.1 g/dL (ref 30.0–36.0)
MCV: 88.6 fL (ref 80.0–100.0)
Platelets: 250 10*3/uL (ref 150–400)
RBC: 4.9 MIL/uL (ref 3.87–5.11)
RDW: 12.8 % (ref 11.5–15.5)
WBC: 6.8 10*3/uL (ref 4.0–10.5)
nRBC: 0 % (ref 0.0–0.2)

## 2020-04-12 LAB — URINALYSIS, ROUTINE W REFLEX MICROSCOPIC
Bilirubin Urine: NEGATIVE
Glucose, UA: NEGATIVE mg/dL
Ketones, ur: 5 mg/dL — AB
Nitrite: NEGATIVE
Protein, ur: 30 mg/dL — AB
Specific Gravity, Urine: 1.027 (ref 1.005–1.030)
pH: 5 (ref 5.0–8.0)

## 2020-04-12 LAB — BASIC METABOLIC PANEL
Anion gap: 14 (ref 5–15)
BUN: 7 mg/dL (ref 6–20)
CO2: 23 mmol/L (ref 22–32)
Calcium: 8.8 mg/dL — ABNORMAL LOW (ref 8.9–10.3)
Chloride: 101 mmol/L (ref 98–111)
Creatinine, Ser: 0.69 mg/dL (ref 0.44–1.00)
GFR calc Af Amer: >60 mL/min (ref >60)
GFR calc non Af Amer: >60 mL/min (ref >60)
Glucose, Bld: 113 mg/dL — ABNORMAL HIGH (ref 70–99)
Potassium: 3.5 mmol/L (ref 3.5–5.1)
Sodium: 138 mmol/L (ref 135–145)

## 2020-04-12 LAB — TROPONIN I (HIGH SENSITIVITY)
Troponin I (High Sensitivity): 4 ng/L (ref <18)
Troponin I (High Sensitivity): <2 ng/L (ref <18)

## 2020-04-12 LAB — I-STAT BETA HCG BLOOD, ED (MC, WL, AP ONLY): I-stat hCG, quantitative: <5 m[IU]/mL (ref <5)

## 2020-04-12 LAB — D-DIMER, QUANTITATIVE: D-Dimer, Quant: 2.42 ug/mL-FEU — ABNORMAL HIGH (ref 0.00–0.50)

## 2020-04-12 MED ORDER — SODIUM CHLORIDE 0.9% FLUSH: 3.0000 mL | Freq: Once | INTRAVENOUS | Status: DC

## 2020-04-12 MED ORDER — IOHEXOL 350 MG/ML SOLN
50.0000 mL | Freq: Once | INTRAVENOUS | Status: AC | PRN
  Administered 2020-04-12: 50 mL via INTRAVENOUS

## 2020-04-12 NOTE — ED Provider Notes (Signed)
[facility] EMERGENCY DEPARTMENT Provider Note   CSN: [id number] Arrival date & time: 04/11/20  2338     History Chief Complaint  Patient presents with  . Chest Pain    Debbie Jackson is a 34 y.o. female.  Patient presents to the emergency department with a chief complaint of chest pain.  She states that she awoke around midnight with some sharp stabbing chest pain.  He states that the pain radiated up to her neck.  She has had similar symptoms with radiation down her left arm in the past and it was told that it was related to anxiety.  She does have history significant for anxiety.  She denies any fevers, chills, shortness of breath, cough.  She does use birth control.  She states that she has never had a PE before.  She states that she has not had any symptoms for the past several hours.  She states she only feels slightly nauseated  The history is provided by the patient. No language interpreter was used.       Past Medical History:  Diagnosis Date  . Migraine   . Renal disorder    since childhood    Patient Active Problem List   Diagnosis Date Noted  . Normal labor 06/11/2015  . GBS bacteriuria 05/05/2015    Past Surgical History:  Procedure Laterality Date  . ECTOPIC PREGNANCY SURGERY     right tube removed after burst tubal pregnancy  . KIDNEY SURGERY       OB History    Gravida  6   Para  3   Term  3   Preterm      AB  3   Living  1     SAB  2   TAB  0   Ectopic  1   Multiple  0   Live Births  1           Family History  Problem Relation Age of Onset  . Hypertension Mother   . Hypertension Father   . Alcohol abuse Neg Hx   . Arthritis Neg Hx   . Asthma Neg Hx   . Birth defects Neg Hx   . Cancer Neg Hx   . COPD Neg Hx   . Depression Neg Hx   . Diabetes Neg Hx   . Drug abuse Neg Hx   . Early death Neg Hx   . Hearing loss Neg Hx   . Heart disease Neg Hx   . Hyperlipidemia Neg Hx   . Kidney disease Neg Hx     . Learning disabilities Neg Hx   . Mental illness Neg Hx   . Mental retardation Neg Hx   . Miscarriages / Stillbirths Neg Hx   . Stroke Neg Hx   . Vision loss Neg Hx   . Varicose Veins Neg Hx     Social History   Tobacco Use  . Smoking status: Never Smoker  . Smokeless tobacco: Never Used  Substance Use Topics  . Alcohol use: No  . Drug use: No    Home Medications Prior to Admission medications   Medication Sig Start Date End Date Taking? Authorizing Provider  amoxicillin (AMOXIL) 500 MG capsule Take 1 capsule (500 mg total) by mouth 3 (three) times daily. 11/29/17   Enid Derry, PA-C  ibuprofen (ADVIL,MOTRIN) 800 MG tablet Take 1 tablet (800 mg total) by mouth every 8 (eight) hours as needed. 09/25/17   Evon Slack, PA-C  lidocaine (XYLOCAINE) 2 % solution Use as directed 10 mLs in the mouth or throat as needed for mouth pain. 11/29/17   Enid Derry, PA-C  NIKKI 3-0.02 MG tablet Take 1 tablet by mouth daily. 07/16/17   [provider]    Allergies    Patient has no known allergies.  Review of Systems   Review of Systems  All other systems reviewed and are negative.   Physical Exam Updated Vital Signs BP 116/79   Pulse 93   Temp (!) 97.5 F (36.4 C) (Oral)   Resp 14   Ht 5\' 6"  (1.676 m)   Wt 79.4 kg   LMP 02/11/2020   SpO2 96%   BMI 28.25 kg/m   Physical Exam Vitals and nursing note reviewed.  Constitutional:      General: She is not in acute distress.    Appearance: She is well-developed.  HENT:     Head: Normocephalic and atraumatic.  Eyes:     Conjunctiva/sclera: Conjunctivae normal.  Cardiovascular:     Rate and Rhythm: Normal rate and regular rhythm.     Heart sounds: No murmur.  Pulmonary:     Effort: Pulmonary effort is normal. No respiratory distress.     Breath sounds: Normal breath sounds.  Abdominal:     Palpations: Abdomen is soft.     Tenderness: There is no abdominal tenderness.  Musculoskeletal:     Cervical back:  Neck supple.  Skin:    General: Skin is warm and dry.  Neurological:     Mental Status: She is alert and oriented to person, place, and time.  Psychiatric:        Mood and Affect: Mood normal.        Behavior: Behavior normal.     ED Results / Procedures / Treatments   Labs (all labs ordered are listed, but only abnormal results are displayed) Labs Reviewed  BASIC METABOLIC PANEL - Abnormal; Notable for the following components:      Result Value   Glucose, Bld 113 (*)    Calcium 8.8 (*)    All other components within normal limits  CBC  D-DIMER, QUANTITATIVE (NOT AT [facility])  I-STAT BETA HCG BLOOD, ED ([facility], [facility], [facility] ONLY)  TROPONIN I (HIGH SENSITIVITY)  TROPONIN I (HIGH SENSITIVITY)    EKG None  Radiology DG Chest 2 View  Result Date: 04/12/2020 CLINICAL DATA:  Chest and anterior neck pain EXAM: CHEST - 2 VIEW COMPARISON:  February 07, 2013 FINDINGS: The heart size and mediastinal contours are within normal limits. Both lungs are clear. The visualized skeletal structures are unremarkable. IMPRESSION: No active cardiopulmonary disease. Electronically Signed   By: February 09, 2013 M.D.   On: 04/12/2020 01:54    Procedures Procedures (including critical care time)  Medications Ordered in ED Medications  sodium chloride flush (NS) 0.9 % injection 3 mL (3 mLs Intravenous Not Given 04/12/20 0554)    ED Course  I have reviewed the triage vital signs and the nursing notes.  Pertinent labs & imaging results that were available during my care of the patient were reviewed by me and considered in my medical decision making (see chart for details).    MDM Rules/Calculators/A&P                      This patient complains of chest pain and neck pain, this involves an extensive number of treatment options, and is a complaint that carries with it a high risk  of complications and morbidity.  The differential diagnosis includes ACS, PE, GERD, anxiety.  Pertinent Labs I ordered, reviewed, and  interpreted labs, which included CBC, BMP, and troponin were ordered triage, they were reviewed by me and showed no acute abnormality.  Given patient's tachycardia and use of birth control, will check D-dimer.  She is not pregnant.  Imaging Interpretation I ordered imaging studies which included chest x-ray.  I independently visualized and interpreted the chest x-ray, which showed no acute abnormality..   Sources Previous records obtained and reviewed normal CTA head and neck 1 year ago.  Patient signed out to [place], [place], who will continue care.  D-dimer pending.  Final Clinical Impression(s) / ED Diagnoses Final diagnoses:  Nonspecific chest pain    Rx / DC Orders ED Discharge Orders    None       Montine Circle, PA-C 04/12/20 2210    Orpah Greek, MD 04/13/20 [phone]

## 2020-04-12 NOTE — Discharge Instructions (Addendum)
Your x-ray, EKG, and blood work are all reassuring today.  It is unclear what caused your chest pain.  Please follow-up with your primary care doctor.  Return to the ER if your symptoms worsen.

## 2020-04-12 NOTE — ED Notes (Signed)
Attempted  To start iv x 2 unsuccessful.

## 2020-04-12 NOTE — ED Notes (Signed)
Return from CT

## 2020-04-12 NOTE — ED Triage Notes (Signed)
The pt is c/o chest and anterior neck pain for one hour with some nausea  lmp 2 months ago

## 2020-04-12 NOTE — ED Provider Notes (Signed)
  Physical Exam  BP 120/79   Pulse 73   Temp 97.9 F (36.6 C) (Oral)   Resp 18   Ht 5\' 6"  (1.676 m)   Wt 79.4 kg   LMP 02/11/2020   SpO2 98%   BMI 28.25 kg/m   Physical Exam Constitutional:      General: She is not in acute distress. HENT:     Head: Normocephalic and atraumatic.  Cardiovascular:     Rate and Rhythm: Normal rate.  Pulmonary:     Effort: Pulmonary effort is normal.  Abdominal:     Palpations: Abdomen is soft.  Musculoskeletal:     Cervical back: Neck supple.  Skin:    General: Skin is dry.  Neurological:     Mental Status: She is oriented to person, place, and time.    Physical Exam  Constitutional: She is oriented to person, place, and time and well-developed, well-nourished, and in no distress. No distress.  HENT:  Head: Normocephalic and atraumatic.  Cardiovascular: Normal rate.  Pulmonary/Chest: Effort normal.  Abdominal: Soft.  Musculoskeletal:     Cervical back: Neck supple.  Neurological: She is oriented to person, place, and time.  Skin: Skin is dry.   ED Course/Procedures     Procedures  MDM  Patient care received from Doran, [place] at shift change, please see his note for full HPI.  Briefly, patient here with anterior neck pain along with nausea and chest pain.  Had a benign work-up including negative troponins.  Is not currently on OCPs, and arrived in the ED mildly tachycardic at 109, plan is for D-dimer level prior to disposition.  9:40 AM lab personally called by me, reports D-dimer was never sent on original lab work, will obtain redraw.  10:00 AM dimer level has returned at 2.42, as were discussed with patient.  A CT angio PE has been order in order to further evaluate for pulmonary embolism.  1:23 PM CT angio was obtained which did not reveal any pulmonary embolism.  These results were discussed at length with patient, she was encouraged to follow-up with her PCP as needed.  Patient is agreeable discharge.  Discharged in  stable condition.  Portions of this note were generated with [place]. Dictation errors may occur despite best attempts at proofreading.         Scientist, clinical (histocompatibility and immunogenetics), PA-C 04/12/20 1324    04/14/20, MD 04/13/20 [phone]

## 2020-05-03 ENCOUNTER — Encounter (HOSPITAL_COMMUNITY): Payer: Self-pay | Admitting: Emergency Medicine

## 2020-05-03 ENCOUNTER — Emergency Department (HOSPITAL_COMMUNITY): Payer: Medicaid Other

## 2020-05-03 ENCOUNTER — Emergency Department (HOSPITAL_COMMUNITY): Payer: Medicaid Other | Admitting: Critical Care Medicine

## 2020-05-03 ENCOUNTER — Other Ambulatory Visit: Payer: Self-pay

## 2020-05-03 ENCOUNTER — Ambulatory Visit (HOSPITAL_COMMUNITY)
Admission: EM | Admit: 2020-05-03 | Discharge: 2020-05-03 | Disposition: A | Discharge status: Home / Self Care | Payer: Medicaid Other | Attending: Emergency Medicine | Admitting: Emergency Medicine

## 2020-05-03 ENCOUNTER — Encounter (HOSPITAL_COMMUNITY)
Admission: EM | Disposition: A | Discharge status: Home / Self Care | Payer: Self-pay | Source: Home / Self Care | Attending: Emergency Medicine

## 2020-05-03 DIAGNOSIS — Z79899 Other long term (current) drug therapy: Secondary | ICD-10-CM | POA: Diagnosis not present

## 2020-05-03 DIAGNOSIS — S62633B Displaced fracture of distal phalanx of left middle finger, initial encounter for open fracture: Secondary | ICD-10-CM | POA: Diagnosis not present

## 2020-05-03 DIAGNOSIS — W540XXA Bitten by dog, initial encounter: Secondary | ICD-10-CM | POA: Insufficient documentation

## 2020-05-03 DIAGNOSIS — Z8249 Family history of ischemic heart disease and other diseases of the circulatory system: Secondary | ICD-10-CM | POA: Insufficient documentation

## 2020-05-03 DIAGNOSIS — Z791 Long term (current) use of non-steroidal anti-inflammatories (NSAID): Secondary | ICD-10-CM | POA: Diagnosis not present

## 2020-05-03 DIAGNOSIS — Z20822 Contact with and (suspected) exposure to covid-19: Secondary | ICD-10-CM | POA: Insufficient documentation

## 2020-05-03 DIAGNOSIS — F419 Anxiety disorder, unspecified: Secondary | ICD-10-CM | POA: Diagnosis not present

## 2020-05-03 DIAGNOSIS — S62639B Displaced fracture of distal phalanx of unspecified finger, initial encounter for open fracture: Secondary | ICD-10-CM

## 2020-05-03 DIAGNOSIS — G43909 Migraine, unspecified, not intractable, without status migrainosus: Secondary | ICD-10-CM | POA: Insufficient documentation

## 2020-05-03 DIAGNOSIS — N289 Disorder of kidney and ureter, unspecified: Secondary | ICD-10-CM | POA: Insufficient documentation

## 2020-05-03 DIAGNOSIS — Y9389 Activity, other specified: Secondary | ICD-10-CM | POA: Diagnosis not present

## 2020-05-03 HISTORY — DX: Displaced fracture of distal phalanx of unspecified finger, initial encounter for open fracture: S62.639B

## 2020-05-03 HISTORY — PX: AMPUTATION: SHX166

## 2020-05-03 LAB — I-STAT BETA HCG BLOOD, ED (MC, WL, AP ONLY): I-stat hCG, quantitative: <5 m[IU]/mL (ref <5)

## 2020-05-03 LAB — SARS CORONAVIRUS 2 BY RT PCR (HOSPITAL ORDER, PERFORMED IN ~~LOC~~ HOSPITAL LAB): SARS Coronavirus 2: NEGATIVE

## 2020-05-03 SURGERY — AMPUTATION DIGIT
Anesthesia: General | Site: Arm Lower | Laterality: Left

## 2020-05-03 MED ORDER — DEXAMETHASONE SODIUM PHOSPHATE 4 MG/ML IJ SOLN
INTRAMUSCULAR | Status: DC | PRN
  Administered 2020-05-03: 4 mg via INTRAVENOUS

## 2020-05-03 MED ORDER — PROPOFOL 10 MG/ML IV BOLUS
INTRAVENOUS | Status: DC | PRN
  Administered 2020-05-03: 200 mg via INTRAVENOUS

## 2020-05-03 MED ORDER — MIDAZOLAM HCL 5 MG/5ML IJ SOLN
INTRAMUSCULAR | Status: DC | PRN
  Administered 2020-05-03: 2 mg via INTRAVENOUS

## 2020-05-03 MED ORDER — ONDANSETRON HCL 4 MG/2ML IJ SOLN
INTRAMUSCULAR | Status: AC
  Filled 2020-05-03: qty 2

## 2020-05-03 MED ORDER — ONDANSETRON HCL 4 MG/2ML IJ SOLN
INTRAMUSCULAR | Status: DC | PRN
  Administered 2020-05-03: 4 mg via INTRAVENOUS

## 2020-05-03 MED ORDER — ORAL CARE MOUTH RINSE: 15.0000 mL | Freq: Once | OROMUCOSAL | Status: AC

## 2020-05-03 MED ORDER — MEPERIDINE HCL 25 MG/ML IJ SOLN: 6.2500 mg | INTRAMUSCULAR | Status: DC | PRN

## 2020-05-03 MED ORDER — CHLORHEXIDINE GLUCONATE 4 % EX LIQD
60.0000 mL | Freq: Once | CUTANEOUS | Status: DC
  Filled 2020-05-03: qty 60

## 2020-05-03 MED ORDER — ONDANSETRON HCL 4 MG/2ML IJ SOLN
4.0000 mg | Freq: Once | INTRAMUSCULAR | Status: AC
  Administered 2020-05-03: 4 mg via INTRAVENOUS
  Filled 2020-05-03: qty 2

## 2020-05-03 MED ORDER — AMOXICILLIN-POT CLAVULANATE 875-125 MG PO TABS
1.0000 | ORAL_TABLET | Freq: Once | ORAL | Status: AC
  Administered 2020-05-03: 1 via ORAL
  Filled 2020-05-03: qty 1

## 2020-05-03 MED ORDER — AMOXICILLIN-POT CLAVULANATE 875-125 MG PO TABS: 1.0000 | ORAL_TABLET | Freq: Two times a day (BID) | ORAL | 0 refills | Status: AC

## 2020-05-03 MED ORDER — CEFAZOLIN SODIUM-DEXTROSE 2-4 GM/100ML-% IV SOLN
2.0000 g | INTRAVENOUS | Status: AC
  Administered 2020-05-03: 2 g via INTRAVENOUS

## 2020-05-03 MED ORDER — HYDROCODONE-ACETAMINOPHEN 5-325 MG PO TABS
1.0000 | ORAL_TABLET | Freq: Once | ORAL | Status: AC
  Administered 2020-05-03: 1 via ORAL
  Filled 2020-05-03: qty 1

## 2020-05-03 MED ORDER — OXYCODONE HCL 5 MG PO TABS: 5.0000 mg | ORAL_TABLET | Freq: Once | ORAL | Status: DC | PRN

## 2020-05-03 MED ORDER — MIDAZOLAM HCL 2 MG/2ML IJ SOLN
INTRAMUSCULAR | Status: AC
  Filled 2020-05-03: qty 2

## 2020-05-03 MED ORDER — 0.9 % SODIUM CHLORIDE (POUR BTL) OPTIME
TOPICAL | Status: DC | PRN
Start: 2020-05-03 — End: 2020-05-03
  Administered 2020-05-03: 1000 mL

## 2020-05-03 MED ORDER — LIDOCAINE HCL (PF) 1 % IJ SOLN
30.0000 mL | Freq: Once | INTRAMUSCULAR | Status: AC
  Administered 2020-05-03: 30 mL
  Filled 2020-05-03: qty 30

## 2020-05-03 MED ORDER — BUPIVACAINE HCL (PF) 0.25 % IJ SOLN
INTRAMUSCULAR | Status: DC | PRN
  Administered 2020-05-03: 6 mL

## 2020-05-03 MED ORDER — FENTANYL CITRATE (PF) 100 MCG/2ML IJ SOLN
INTRAMUSCULAR | Status: DC | PRN
  Administered 2020-05-03 (×2): 50 ug via INTRAVENOUS

## 2020-05-03 MED ORDER — LACTATED RINGERS IV SOLN: INTRAVENOUS | Status: DC

## 2020-05-03 MED ORDER — HYDROMORPHONE HCL 1 MG/ML IJ SOLN: 0.2500 mg | INTRAMUSCULAR | Status: DC | PRN

## 2020-05-03 MED ORDER — PROPOFOL 10 MG/ML IV BOLUS
INTRAVENOUS | Status: AC
  Filled 2020-05-03: qty 20

## 2020-05-03 MED ORDER — OXYCODONE HCL 5 MG PO TABS: 5.0000 mg | ORAL_TABLET | ORAL | 0 refills | Status: DC | PRN

## 2020-05-03 MED ORDER — OXYCODONE HCL 5 MG/5ML PO SOLN: 5.0000 mg | Freq: Once | ORAL | Status: DC | PRN

## 2020-05-03 MED ORDER — PROMETHAZINE HCL 25 MG/ML IJ SOLN: 6.2500 mg | INTRAMUSCULAR | Status: DC | PRN

## 2020-05-03 MED ORDER — DEXAMETHASONE SODIUM PHOSPHATE 10 MG/ML IJ SOLN
INTRAMUSCULAR | Status: AC
  Filled 2020-05-03: qty 1

## 2020-05-03 MED ORDER — CEFAZOLIN SODIUM-DEXTROSE 2-4 GM/100ML-% IV SOLN
2.0000 g | Freq: Once | INTRAVENOUS | Status: AC
  Administered 2020-05-03: 2 g via INTRAVENOUS
  Filled 2020-05-03: qty 100

## 2020-05-03 MED ORDER — LIDOCAINE 2% (20 MG/ML) 5 ML SYRINGE
INTRAMUSCULAR | Status: AC
  Filled 2020-05-03: qty 5

## 2020-05-03 MED ORDER — LIDOCAINE 2% (20 MG/ML) 5 ML SYRINGE
INTRAMUSCULAR | Status: DC | PRN
  Administered 2020-05-03: 100 mg via INTRAVENOUS

## 2020-05-03 MED ORDER — BUPIVACAINE HCL (PF) 0.25 % IJ SOLN
INTRAMUSCULAR | Status: AC
  Filled 2020-05-03: qty 30

## 2020-05-03 MED ORDER — CEFAZOLIN SODIUM 1 G IJ SOLR
INTRAMUSCULAR | Status: AC
  Filled 2020-05-03: qty 20

## 2020-05-03 MED ORDER — FENTANYL CITRATE (PF) 250 MCG/5ML IJ SOLN
INTRAMUSCULAR | Status: AC
  Filled 2020-05-03: qty 5

## 2020-05-03 MED ORDER — CHLORHEXIDINE GLUCONATE 0.12 % MT SOLN
15.0000 mL | Freq: Once | OROMUCOSAL | Status: AC
  Administered 2020-05-03: 15 mL via OROMUCOSAL
  Filled 2020-05-03 (×2): qty 15

## 2020-05-03 MED ORDER — POVIDONE-IODINE 10 % EX SWAB: 2.0000 "application " | Freq: Once | CUTANEOUS | Status: DC

## 2020-05-03 SURGICAL SUPPLY — 48 items
BNDG CMPR 75X41 PLY ABS (GAUZE/BANDAGES/DRESSINGS) ×1
BNDG COHESIVE 1X5 TAN STRL LF (GAUZE/BANDAGES/DRESSINGS) ×2 IMPLANT
BNDG CONFORM 2 STRL LF (GAUZE/BANDAGES/DRESSINGS) IMPLANT
BNDG STRETCH 4X75 NS LF (GAUZE/BANDAGES/DRESSINGS) ×2 IMPLANT
CORD BIPOLAR FORCEPS 12FT (ELECTRODE) ×3 IMPLANT
COVER SURGICAL LIGHT HANDLE (MISCELLANEOUS) ×3 IMPLANT
COVER WAND RF STERILE (DRAPES) ×3 IMPLANT
CUFF TOURN SGL QUICK 18X4 (TOURNIQUET CUFF) ×3 IMPLANT
CUFF TOURN SGL QUICK 24 (TOURNIQUET CUFF)
CUFF TRNQT CYL 24X4X16.5-23 (TOURNIQUET CUFF) IMPLANT
DRAPE SURG 17X23 STRL (DRAPES) ×3 IMPLANT
DRSG MEPITEL 4X7.2 (GAUZE/BANDAGES/DRESSINGS) ×3 IMPLANT
DRSG XEROFORM 1X8 (GAUZE/BANDAGES/DRESSINGS) ×2 IMPLANT
GAUZE SPONGE 2X2 8PLY STRL LF (GAUZE/BANDAGES/DRESSINGS) IMPLANT
GAUZE SPONGE 4X4 12PLY STRL (GAUZE/BANDAGES/DRESSINGS) IMPLANT
GAUZE SPONGE 4X4 12PLY STRL LF (GAUZE/BANDAGES/DRESSINGS) ×2 IMPLANT
GAUZE XEROFORM 1X8 LF (GAUZE/BANDAGES/DRESSINGS) IMPLANT
GLOVE BIOGEL M 8.0 STRL (GLOVE) ×3 IMPLANT
GLOVE SS BIOGEL STRL SZ 8 (GLOVE) ×1 IMPLANT
GLOVE SUPERSENSE BIOGEL SZ 8 (GLOVE) ×2
GOWN STRL REUS W/ TWL LRG LVL3 (GOWN DISPOSABLE) ×1 IMPLANT
GOWN STRL REUS W/ TWL XL LVL3 (GOWN DISPOSABLE) ×2 IMPLANT
GOWN STRL REUS W/TWL LRG LVL3 (GOWN DISPOSABLE) ×3
GOWN STRL REUS W/TWL XL LVL3 (GOWN DISPOSABLE) ×6
K-WIRE DBL TROCAR .045X4 (WIRE) ×3
KIT BASIN OR (CUSTOM PROCEDURE TRAY) ×3 IMPLANT
KIT TURNOVER KIT B (KITS) ×3 IMPLANT
KWIRE DBL TROCAR .045X4 (WIRE) IMPLANT
MANIFOLD NEPTUNE II (INSTRUMENTS) ×3 IMPLANT
NDL HYPO 25GX1X1/2 BEV (NEEDLE) IMPLANT
NEEDLE HYPO 25GX1X1/2 BEV (NEEDLE) IMPLANT
NS IRRIG 1000ML POUR BTL (IV SOLUTION) ×3 IMPLANT
PACK ORTHO EXTREMITY (CUSTOM PROCEDURE TRAY) ×3 IMPLANT
PAD ARMBOARD 7.5X6 YLW CONV (MISCELLANEOUS) ×6 IMPLANT
SOL PREP POV-IOD 4OZ 10% (MISCELLANEOUS) ×9 IMPLANT
SPECIMEN JAR SMALL (MISCELLANEOUS) ×3 IMPLANT
SPONGE GAUZE 2X2 STER 10/PKG (GAUZE/BANDAGES/DRESSINGS)
SUT CHROMIC 5 0 P 3 (SUTURE) ×2 IMPLANT
SUT MERSILENE 4 0 P 3 (SUTURE) IMPLANT
SUT PROLENE 3 0 PS 2 (SUTURE) ×2 IMPLANT
SUT PROLENE 4 0 PS 2 18 (SUTURE) IMPLANT
SYR CONTROL 10ML LL (SYRINGE) IMPLANT
TOWEL GREEN STERILE (TOWEL DISPOSABLE) ×3 IMPLANT
TOWEL GREEN STERILE FF (TOWEL DISPOSABLE) ×3 IMPLANT
TUBE CONNECTING 12'X1/4 (SUCTIONS)
TUBE CONNECTING 12X1/4 (SUCTIONS) IMPLANT
UNDERPAD 30X36 HEAVY ABSORB (UNDERPADS AND DIAPERS) ×3 IMPLANT
WATER STERILE IRR 1000ML POUR (IV SOLUTION) ×3 IMPLANT

## 2020-05-03 NOTE — Discharge Instructions (Signed)
-  You should maintain your postoperative dressing clean and dry for the next 10 days.  This will be removed in the office by Dr. Aundria Rud and his staff.  -You should elevate your left hand with your "hand above heart" at all times.  -For mild to moderate pain you should utilize Tylenol and Advil, alternating, around-the-clock.  For breakthrough pain use oxycodone as necessary.  -You should complete 7 days of Augmentin antibiotics as prescribed by Dr. Jeraldine Loots in the emergency department.  -Return to see Dr. Aundria Rud in 10 to 14 days for suture removal and wound check.

## 2020-05-03 NOTE — ED Triage Notes (Signed)
Pt to triage via [facility] from home.  Pt was breaking up dog fight between her dogs that are UTD on vaccines when she got bit.  C/o lac to L middle finger and puncture to R index finger.  EMS administered Fentanyl .  Pt with nausea and diaphoretic on arrival.

## 2020-05-03 NOTE — ED Provider Notes (Signed)
[facility] EMERGENCY DEPARTMENT Provider Note   CSN: [id number] Arrival date & time: 05/03/20  7782     History Chief Complaint  Patient presents with  . Animal Bite    Debbie Jackson is a 34 y.o. female.  HPI    Presents after sustaining injury to both hands. Patient was in her usual state of health, states that she is generally well.  Just prior to ED arrival the patient was attempting to break up a fight between her 2 dogs.  She sustained a small laceration to her right hand, distal second digit, not painful, with no active bleeding, no loss of function.  Or substantial, the patient sustained a injury to her left distal third digit.  There is gross disruption of the tissue, and substantial pain, sharp, severe, persistent since the event.  She cannot feel, nor move the distal portion of her third digit. She denies other injuries. Dogs are both well, rabies status is up-to-date. He received fentanyl in route but continues to have severe pain in the digit. Patient is accompanied by her husband who assists with the HPI.  Past Medical History:  Diagnosis Date  . Anxiety   . Migraine   . Renal disorder    since childhood    Patient Active Problem List   Diagnosis Date Noted  . Normal labor 06/11/2015  . GBS bacteriuria 05/05/2015    Past Surgical History:  Procedure Laterality Date  . ECTOPIC PREGNANCY SURGERY     right tube removed after burst tubal pregnancy  . KIDNEY SURGERY       OB History    Gravida  6   Para  3   Term  3   Preterm      AB  3   Living  1     SAB  2   TAB  0   Ectopic  1   Multiple  0   Live Births  1           Family History  Problem Relation Age of Onset  . Hypertension Mother   . Hypertension Father   . Alcohol abuse Neg Hx   . Arthritis Neg Hx   . Asthma Neg Hx   . Birth defects Neg Hx   . Cancer Neg Hx   . COPD Neg Hx   . Depression Neg Hx   . Diabetes Neg Hx   . Drug abuse Neg Hx   .  Early death Neg Hx   . Hearing loss Neg Hx   . Heart disease Neg Hx   . Hyperlipidemia Neg Hx   . Kidney disease Neg Hx   . Learning disabilities Neg Hx   . Mental illness Neg Hx   . Mental retardation Neg Hx   . Miscarriages / Stillbirths Neg Hx   . Stroke Neg Hx   . Vision loss Neg Hx   . Varicose Veins Neg Hx     Social History   Tobacco Use  . Smoking status: Never Smoker  . Smokeless tobacco: Never Used  Substance Use Topics  . Alcohol use: No  . Drug use: No    Home Medications Prior to Admission medications   Medication Sig Start Date End Date Taking? Authorizing Provider  amoxicillin (AMOXIL) 500 MG capsule Take 1 capsule (500 mg total) by mouth 3 (three) times daily. Patient not taking: Reported on 04/12/2020 11/29/17   Enid Derry, PA-C  cetirizine (ZYRTEC) 10 MG tablet Take 10 mg  by mouth daily.    [provider]  DULoxetine (CYMBALTA) 60 MG capsule Take 60 mg by mouth daily. 01/14/20 01/13/21  [provider]  ibuprofen (ADVIL,MOTRIN) 800 MG tablet Take 1 tablet (800 mg total) by mouth every 8 (eight) hours as needed. Patient not taking: Reported on 04/12/2020 09/25/17   Evon Slack, PA-C  lidocaine (XYLOCAINE) 2 % solution Use as directed 10 mLs in the mouth or throat as needed for mouth pain. Patient not taking: Reported on 04/12/2020 11/29/17   Enid Derry, PA-C  NIKKI 3-0.02 MG tablet Take 1 tablet by mouth daily. 07/16/17   [provider]    Allergies    Patient has no known allergies.  Review of Systems   Review of Systems  Constitutional:       Per HPI, otherwise negative  HENT:       Per HPI, otherwise negative  Respiratory:       Per HPI, otherwise negative  Cardiovascular:       Per HPI, otherwise negative  Gastrointestinal: Positive for nausea. Negative for vomiting.  Endocrine:       Negative aside from HPI  Genitourinary:       Neg aside from HPI   Musculoskeletal:       Per HPI, otherwise negative  Skin:  Positive for wound.  Neurological: Negative for syncope.    Physical Exam Updated Vital Signs BP 112/77 (BP Location: Left Arm)   Pulse 72   Temp 97.9 F (36.6 C) (Oral)   Resp 16   LMP 04/04/2020   SpO2 99%    Physical Exam Vitals and nursing note reviewed.  Constitutional:      General: She is not in acute distress.    Appearance: She is well-developed.  HENT:     Head: Normocephalic and atraumatic.  Eyes:     Conjunctiva/sclera: Conjunctivae normal.  Cardiovascular:     Rate and Rhythm: Normal rate and regular rhythm.     Pulses: No decreased pulses.  Pulmonary:     Effort: Pulmonary effort is normal. No respiratory distress.     Breath sounds: No stridor.  Abdominal:     General: There is no distension.  Musculoskeletal:       Arms:     Comments: Distal second digit less than 0.5 cm skin avulsion, no bleeding.  Range of motion, sensation appropriate throughout the digit and the hand.  Skin:    General: Skin is warm and dry.  Neurological:     Mental Status: She is alert and oriented to person, place, and time.     Cranial Nerves: No cranial nerve deficit.       ED Results / Procedures / Treatments   Labs (all labs ordered are listed, but only abnormal results are displayed) Labs Reviewed  I-STAT BETA HCG BLOOD, ED ([facility], [facility], [facility] ONLY)    EKG None  Radiology No results found.  Procedures .Nerve Block  Date/Time: 05/03/2020 12:00 PM Performed by: Gerhard Munch, MD Authorized by: Gerhard Munch, MD   Consent:    Consent obtained:  Verbal   Consent given by:  Patient   Risks discussed:  Infection, pain, swelling and unsuccessful block   Alternatives discussed:  No treatment Universal protocol:    Procedure explained and questions answered to patient or proxy's satisfaction: yes     Required blood products, implants, devices, and special equipment available: yes     Site marked: yes     Time out called:  yes     Patient identity confirmed:   Verbally with patient Indications:    Indications:  Pain relief Location:    Body area:  Upper extremity   Upper extremity nerve blocked: base of third digit.   Laterality:  Left Pre-procedure details:    Skin preparation:  Alcohol   Preparation: Patient was prepped and draped in usual sterile fashion   Skin anesthesia (see MAR for exact dosages):    Skin anesthesia method:  Local infiltration   Local anesthetic:  Lidocaine 1% w/o epi Procedure details (see MAR for exact dosages):    Block needle gauge:  27 G   Steroid injected:  None   Additive injected:  None   Injection procedure:  Anatomic landmarks identified, incremental injection, introduced needle and negative aspiration for blood Post-procedure details:    Outcome:  Pain improved   Patient tolerance of procedure:  Tolerated well, no immediate complications Comments:     Patient had improved, though not completely resolved pain distally to the block.   (including critical care time)  Medications Ordered in ED Medications  ondansetron (ZOFRAN) injection 4 mg (4 mg Intravenous Given 05/03/20 1051)  HYDROcodone-acetaminophen (NORCO/VICODIN) 5-325 MG per tablet 1 tablet (1 tablet Oral Given 05/03/20 1118)  lidocaine (PF) (XYLOCAINE) 1 % injection 30 mL (30 mLs Infiltration Given by Other 05/03/20 1121)  amoxicillin-clavulanate (AUGMENTIN) 875-125 MG per tablet 1 tablet (1 tablet Oral Given 05/03/20 1118)    ED Course  I have reviewed the triage vital signs and the nursing notes.  Pertinent labs & imaging results that were available during my care of the patient were reviewed by me and considered in my medical decision making (see chart for details).     Needed after the initial evaluation with consideration of infection patient was prescribed Augmentin, received analgesia orally, and digital block.  X-ray ordered.  Irrigation performed as well.  1:08 PM Have seen and reviewed the x-rays, discussed them with the patient and her  husband. On exam she is awake, alert, hemodynamically unremarkable. After digital block she had some improvement in pain, likely augmented by oral narcotics as well. Concern for dog bite, near complete amputation, after provision of initial antibiotics, I discussed her case with our hand surgeon on-call, Dr. Stann Mainland. Patient will require operating room cleanout, possible completion of amputation.  MDM Number of Diagnoses or Management Options   Amount and/or Complexity of Data Reviewed Clinical lab tests: reviewed Tests in the radiology section of CPT: reviewed Tests in the medicine section of CPT: reviewed Discussion of test results with the performing providers: yes Obtain history from someone other than the patient: yes Discuss the patient with other providers: yes Independent visualization of images, tracings, or specimens: yes  Risk of Complications, Morbidity, and/or Mortality Presenting problems: high Diagnostic procedures: high Management options: high   Final Clinical Impression(s) / ED Diagnoses Final diagnoses:  Dog bite, initial encounter     Carmin Muskrat, MD 05/03/20 1310

## 2020-05-03 NOTE — H&P (Signed)
ORTHOPAEDIC H and P  REQUESTING PHYSICIAN: No att. providers found  PCP:  Patient, No Pcp Per  Chief Complaint: dog bites  HPI: Debbie Jackson is a 34 y.o. female who complains of left hand middle finger near amputation from a dog bite.  She is a right-hand-dominant individual who has no past medical history but does work full-time in a daycare setting.  She states that she was trying to break up a fight between her dogs this morning.  She had a small puncture wound to the right index finger at the PIP joint.  There was also a significant injury to the left hand through the distal phalanx of the long finger.  This was a near amputation.  She presented to our emergency department via EMS.  She was assessed and evaluated in our ER by Dr. Adalberto Cole.  He has updated her tetanus and treated her prophylactically with IV antibiotics for this open injury.  X-rays did show a transverse fracture of the distal phalanx with exposed bone on clinical exam.  She is now being assessed by me for need of hand surgery intervention.  She has numbness in the tip of the left long finger.  He is unable to actively move the finger as well.  No history of previous trauma or surgery to the right or left hand.  Past Medical History:  Diagnosis Date  . Anxiety   . Migraine   . Renal disorder    since childhood   Past Surgical History:  Procedure Laterality Date  . ECTOPIC PREGNANCY SURGERY     right tube removed after burst tubal pregnancy  . KIDNEY SURGERY     Social History   Socioeconomic History  . Marital status: Single    Spouse name: Not on file  . Number of children: Not on file  . Years of education: Not on file  . Highest education level: Not on file  Occupational History  . Not on file  Tobacco Use  . Smoking status: Never Smoker  . Smokeless tobacco: Never Used  Substance and Sexual Activity  . Alcohol use: No  . Drug use: No  . Sexual activity: Yes    Birth control/protection: None    Other Topics Concern  . Not on file  Social History Narrative  . Not on file   Social Determinants of Health   Financial Resource Strain:   . Difficulty of Paying Living Expenses:   Food Insecurity:   . Worried About Programme researcher, broadcasting/film/video in the Last Year:   . Barista in the Last Year:   Transportation Needs:   . Freight forwarder (Medical):   Marland Kitchen Lack of Transportation (Non-Medical):   Physical Activity:   . Days of Exercise per Week:   . Minutes of Exercise per Session:   Stress:   . Feeling of Stress :   Social Connections:   . Frequency of Communication with Friends and Family:   . Frequency of Social Gatherings with Friends and Family:   . Attends Religious Services:   . Active Member of Clubs or Organizations:   . Attends Banker Meetings:   Marland Kitchen Marital Status:    Family History  Problem Relation Age of Onset  . Hypertension Mother   . Hypertension Father   . Alcohol abuse Neg Hx   . Arthritis Neg Hx   . Asthma Neg Hx   . Birth defects Neg Hx   . Cancer Neg Hx   .  COPD Neg Hx   . Depression Neg Hx   . Diabetes Neg Hx   . Drug abuse Neg Hx   . Early death Neg Hx   . Hearing loss Neg Hx   . Heart disease Neg Hx   . Hyperlipidemia Neg Hx   . Kidney disease Neg Hx   . Learning disabilities Neg Hx   . Mental illness Neg Hx   . Mental retardation Neg Hx   . Miscarriages / Stillbirths Neg Hx   . Stroke Neg Hx   . Vision loss Neg Hx   . Varicose Veins Neg Hx    No Known Allergies Prior to Admission medications   Medication Sig Start Date End Date Taking? Authorizing Provider  cetirizine (ZYRTEC) 10 MG tablet Take 10 mg by mouth daily.   Yes [provider]  DULoxetine (CYMBALTA) 60 MG capsule Take 60 mg by mouth daily. 01/14/20 01/13/21 Yes [provider]  NIKKI 3-0.02 MG tablet Take 1 tablet by mouth daily. 07/16/17  Yes [provider]  ibuprofen (ADVIL,MOTRIN) 800 MG tablet Take 1 tablet (800 mg total) by  mouth every 8 (eight) hours as needed. Patient not taking: Reported on 04/12/2020 09/25/17   Evon Slack, PA-C  lidocaine (XYLOCAINE) 2 % solution Use as directed 10 mLs in the mouth or throat as needed for mouth pain. Patient not taking: Reported on 04/12/2020 11/29/17   Enid Derry, PA-C   DG Hand Complete Left  Result Date: 05/03/2020 CLINICAL DATA:  Dog bite EXAM: LEFT HAND - COMPLETE 3+ VIEW; RIGHT HAND - COMPLETE 3+ VIEW COMPARISON:  None. FINDINGS: Fractures of the tuft of the left third distal phalanx with an overlying soft tissue laceration. No radiopaque foreign body. No other fracture or dislocation of the bilateral hands. Joint spaces are preserved. IMPRESSION: 1. Fractures of the tuft of the left third distal phalanx with an overlying soft tissue laceration. No radiopaque foreign body. 2.  No other fracture or dislocation of the bilateral hands. Electronically Signed   By: Lauralyn Primes M.D.   On: 05/03/2020 12:08   DG Hand Complete Right  Result Date: 05/03/2020 CLINICAL DATA:  Dog bite EXAM: LEFT HAND - COMPLETE 3+ VIEW; RIGHT HAND - COMPLETE 3+ VIEW COMPARISON:  None. FINDINGS: Fractures of the tuft of the left third distal phalanx with an overlying soft tissue laceration. No radiopaque foreign body. No other fracture or dislocation of the bilateral hands. Joint spaces are preserved. IMPRESSION: 1. Fractures of the tuft of the left third distal phalanx with an overlying soft tissue laceration. No radiopaque foreign body. 2.  No other fracture or dislocation of the bilateral hands. Electronically Signed   By: Lauralyn Primes M.D.   On: 05/03/2020 12:08   DG MINI C-ARM IMAGE ONLY  Result Date: 05/03/2020 There is no interpretation for this exam.  This order is for images obtained during a surgical procedure.  Please See "Surgeries" Tab for more information regarding the procedure.    Positive ROS: All other systems have been reviewed and were otherwise negative with the exception of  those mentioned in the HPI and as above.  Physical Exam: General: Alert, no acute distress Cardiovascular: No pedal edema Respiratory: No cyanosis, no use of accessory musculature GI: No organomegaly, abdomen is soft and non-tender Skin: No lesions in the area of chief complaint Neurologic: Sensation intact distally Psychiatric: Patient is competent for consent with normal mood and affect Lymphatic: No axillary or cervical lymphadenopathy  MUSCULOSKELETAL:  Left  hand:  Bandage in place.  I did review medical record where images showed a near full amputation through the distal phalanx of the left long finger.  There is exposed bone on the distal and proximal stumps.  There is bleeding healthy tissue noted as well.  Right hand:  There is a puncture wound at the PIP joint of the index finger on the radial border.  This is hemostatic.  There is no surrounding erythema.  She has full range of motion through the IP joints with no lag.  Distally neurovascular intact.  Assessment: 1.  Left long finger open tuft fracture  2.  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  Dog bite injury to right index finger  Plan: -For the right hand we discussed empiric antibiotic treatment for this puncture injury.  There is some concern for intra-articular involvement.  Will complete close clinical follow-up for this injury.  We will send her out on 1 week of empiric Augmentin antibiotics.  -Regarding the left hand we discussed that this is a significant injury that will require operative intervention.  Plan will be for potential open reduction and internal fixation with nailbed repair as well as irrigation debridement of the open injury.  If the vascular bundle on the radial side is also been compromised will be opting for a revision amputation.  We counseled her on both scenarios.  She is in agreement with moving ahead with surgery and is okay with either treatment as long as that is  the appropriate option for her injury.  -We discussed the risk of the procedure including but not limited to bleeding, infection, nonunion, malunion, need for revision or further surgery, stiffness and limitations of use of the left long finger as well as the risk of anesthesia.  She has provided informed consent.  -We will plan for discharge home from PACU after the surgical procedure.    Nicholes Stairs, MD Cell [phone]    05/03/2020 1:58 PM

## 2020-05-03 NOTE — Anesthesia Postprocedure Evaluation (Signed)
Anesthesia Post Note  Patient: Debbie Jackson  Procedure(s) Performed: Irrigation and debridement, complex repair of left long finger with primary closure, open reduction and percutaneous pinning, and nail bed repair, left long finger. (Left Arm Lower)     Patient location during evaluation: PACU Anesthesia Type: General Level of consciousness: sedated and patient cooperative Pain management: pain level controlled Vital Signs Assessment: post-procedure vital signs reviewed and stable Respiratory status: spontaneous breathing Cardiovascular status: stable Anesthetic complications: no    Last Vitals:  Vitals:   05/03/20 1610 05/03/20 1625  BP:  127/72  Pulse: 86 88  Resp: 19 19  Temp:  36.6 C  SpO2: 99% 100%    Last Pain:  Vitals:   05/03/20 1625  TempSrc:   PainSc: 0-No pain                 [name]

## 2020-05-03 NOTE — Anesthesia Preprocedure Evaluation (Addendum)
Anesthesia Evaluation  Patient identified by MRN, date of birth, ID band Patient awake    Reviewed: Allergy & Precautions, NPO status , Patient's Chart, lab work & pertinent test results  Airway Mallampati: II  TM Distance: >3 FB Neck ROM: Full    Dental no notable dental hx. (+) Dental Advisory Given, Teeth Intact   Pulmonary neg pulmonary ROS,    Pulmonary exam normal breath sounds clear to auscultation       Cardiovascular negative cardio ROS Normal cardiovascular exam Rhythm:Regular Rate:Normal     Neuro/Psych  Headaches, PSYCHIATRIC DISORDERS Anxiety    GI/Hepatic negative GI ROS, Neg liver ROS,   Endo/Other  negative endocrine ROS  Renal/GU negative Renal ROS     Musculoskeletal negative musculoskeletal ROS (+)   Abdominal   Peds  Hematology negative hematology ROS (+)   Anesthesia Other Findings   Reproductive/Obstetrics negative OB ROS                            Anesthesia Physical Anesthesia Plan  ASA: II and emergent  Anesthesia Plan: General   Post-op Pain Management:    Induction: Intravenous  PONV Risk Score and Plan: 4 or greater and Ondansetron, Dexamethasone, Midazolam and Treatment may vary due to age or medical condition  Airway Management Planned: LMA  Additional Equipment: None  Intra-op Plan:   Post-operative Plan: Extubation in OR  Informed Consent: I have reviewed the patients History and Physical, chart, labs and discussed the procedure including the risks, benefits and alternatives for the proposed anesthesia with the patient or authorized representative who has indicated his/her understanding and acceptance.     Dental advisory given  Plan Discussed with: CRNA  Anesthesia Plan Comments:        Anesthesia Quick Evaluation

## 2020-05-03 NOTE — Anesthesia Procedure Notes (Signed)
Procedure Name: LMA Insertion Date/Time: 05/03/2020 3:05 PM Performed by: Colon Flattery, CRNA Pre-anesthesia Checklist: Patient identified, Emergency Drugs available, Suction available and Patient being monitored Patient Re-evaluated:Patient Re-evaluated prior to induction Oxygen Delivery Method: Circle system utilized Preoxygenation: Pre-oxygenation with 100% oxygen Induction Type: IV induction Ventilation: Mask ventilation without difficulty LMA: LMA inserted LMA Size: 4.0 Number of attempts: 1 Placement Confirmation: positive ETCO2 and breath sounds checked- equal and bilateral Tube secured with: Tape Dental Injury: Teeth and Oropharynx as per pre-operative assessment

## 2020-05-03 NOTE — Brief Op Note (Signed)
05/03/2020  3:57 PM  PATIENT:  Debbie Jackson  34 y.o. female  PRE-OPERATIVE DIAGNOSIS:  1.  Left long finger open tuft fracture 2.  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  Left long finger nailbed injury  POST-OPERATIVE DIAGNOSIS:  same  PROCEDURE:   #1.  Irrigation and debridement for open fracture of left long finger open tuft fracture 2.  Complex repair of left long finger nailbed 3.  Open reduction and percutaneous pinning of left long finger distal phalanx with 0.045 Kirschner wire. 4.  Complex repair of partial amputation of left long finger with primary closure. 5.  Removal of 1 fingernail left long finger.  SURGEON:  Surgeon(s) and Role:    * Smaran Gaus, Noah Delaine, MD - Primary  PHYSICIAN ASSISTANT:   ASSISTANTS: none   ANESTHESIA:   local and general  EBL:  5 cc  BLOOD ADMINISTERED:none  DRAINS: none   LOCAL MEDICATIONS USED:  MARCAINE     SPECIMEN:  No Specimen  DISPOSITION OF SPECIMEN:  N/A  COUNTS:  YES  TOURNIQUET:   Total Tourniquet Time Documented: Upper Arm (Left) - 31 minutes Total: Upper Arm (Left) - 31 minutes   DICTATION: .Note written in EPIC  PLAN OF CARE: Discharge to home after PACU  PATIENT DISPOSITION:  PACU - hemodynamically stable.   Delay start of Pharmacological VTE agent (>24hrs) due to surgical blood loss or risk of bleeding: not applicable

## 2020-05-03 NOTE — Op Note (Signed)
Date of Surgery: 05/03/2020  INDICATIONS: Debbie Jackson is a 34 y.o.-year-old right handed female with a left long finger partial traumatic amputation after being bitten by a dog.  She states that she was trying to break up an altercation between her 2 pet dogs when she sustained the injury.  This resulted in an open tuft fracture, complex injury to the nailbed, and complex laceration/near amputation through the distal phalanx.;  The patient did consent to the procedure after discussion of the risks and benefits.  PRE-OPERATIVE DIAGNOSIS:  1.  Left long finger open tuft fracture 2.  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  Left long finger nailbed injury  POST-OPERATIVE DIAGNOSIS:  same  PROCEDURE:   1.  Irrigation and debridement for open fracture of left long finger open tuft (distal phalanx) fracture 2.  Complex repair of left long finger nailbed 3.  Open reduction and percutaneous pinning of left long finger distal phalanx with 0.045 Kirschner wire. 4.  Complex repair of partial amputation of left long finger with primary closure. 5.  Removal of 1 fingernail left long finger. 6.  Intraoperative fluoroscopy 2 views left long finger.  AP and lateral, independently interpreted by myself.  SURGEON: Geralynn Rile, M.D.  ASSIST: none.  ANESTHESIA:  general, and digital block  IV FLUIDS AND URINE: See anesthesia.  ESTIMATED BLOOD LOSS: 5 mL.  IMPLANTS:  0.045 mm Kirschner wire x1  DRAINS: None  COMPLICATIONS: None.  Tourniquet time: 31 minutes at 250 mmHg  DESCRIPTION OF PROCEDURE: The patient was brought to the operating room and placed supine on the operating table.  The patient had been signed prior to the procedure and this was documented. The patient had the anesthesia placed by the anesthesiologist.  A time-out was performed to confirm that this was the correct patient, site, side and location. The patient did receive antibiotics prior to the  incision and was re-dosed during the procedure as needed at indicated intervals.  A tourniquet was placed.  The patient had the operative extremity prepped and draped in the standard surgical fashion.     We began the procedure with the excisional irrigation and debridement for open fracture to left hand long finger distal phalanx.  Using a 15 blade as well as curettes and rondure we did excise hematoma as well as nonviable skin, subcutaneous tissue, bone and muscle.  Of note there was complete disruption of the ulnar neurovascular bundle in the zone of injury.  The digital nerve was pulled into the wound and sharply cut such that it retracted beyond the edge of the laceration.  The radial neurovascular bundle was intact.  The radial flap had good inflow and outflow to the near amputated stump.  We then copiously irrigated the wound with normal saline.  Next we moved to open reduction and percutaneous pinning of the distal phalanx fracture.  This was a transverse fracture but there was some comminution noted on the ulnar aspect.  This did cause some shortening through the fracture.  However, it was able to be appropriately pinned.  Through direct visualization in AP and lateral intraoperative fluoroscopy with the mini C arm we placed a pin across the fracture fragment as well as through the DIP joint for further stabilization.  Reduction and placement of pin was confirmed on C-arm both AP and lateral views.  The pin was then cut just distal to the tip of the skin and a protective device was placed over the sharp aspect.  Next we moved to address the open nailbed injury.  We first remove the remaining fingernail.  This was accomplished with a pickup and 15 blade.  We were careful to not excise the nailbed itself.  This nail was removed in 2 segments as there was a transverse component through the nail itself that did transect the nail.  After removing the nail, we then performed a nailbed repair.  Using 5-0  chromic suture we reapproximated the stellate pattern of disruption of the nailbed itself.  Proximally, under the eponychial fold there was no disruption of the germinal matrix.  The nail bed did have some tissue missing along the most ulnar aspect.  We were still able to get some sutures in that area as well.  We then irrigated the repaired nailbed once again.  Then, we moved to repair the remaining aspects of this complex wound to the distal aspect of the left long finger.  Utilizing 3-0 Prolene suture in interrupted fashion we primarily repaired the complex wound on the volar and ulnar aspect of the distal phalanx.  The hand was then cleaned and dried.  A standard sterile bandage and bulky Coban dressing was applied to the long finger.  All counts were correct x2.  The tourniquet was dropped and the distal phalanx had good perfusion.  Patient was awakened from general anesthetic and transported to PACU in stable condition.  POSTOPERATIVE PLAN:  Shivani will be in her postoperative dressing for the next 10 days.  I will see her in the office where this will be removed.  We will maintain the pin for 4 weeks.  This will be removed in the office.  She will complete a 7-day course of Augmentin for this open injury as well as the puncture wound to her right index finger.

## 2020-05-03 NOTE — Transfer of Care (Signed)
Immediate Anesthesia Transfer of Care Note  Patient: Debbie Jackson  Procedure(s) Performed: REVISION AMPUTATION MIDDLE FINGER (Left )  Patient Location: PACU  Anesthesia Type:General  Level of Consciousness: awake, alert  and oriented  Airway & Oxygen Therapy: Patient Spontanous Breathing  Post-op Assessment: Report given to RN, Post -op Vital signs reviewed and stable and Patient moving all extremities  Post vital signs: Reviewed and stable  Last Vitals:  Vitals Value Taken Time  BP 116/82 05/03/20 1556  Temp    Pulse 85 05/03/20 1601  Resp 18 05/03/20 1601  SpO2 100 % 05/03/20 1601  Vitals shown include unvalidated device data.  Last Pain:  Vitals:   05/03/20 1555  TempSrc:   PainSc: 0-No pain      Patients Stated Pain Goal: 3 (05/03/20 1408)  Complications: No apparent anesthesia complications

## 2020-05-03 NOTE — ED Notes (Signed)
Elwin Sleight cell # [phone] and  [phone]

## 2020-08-04 ENCOUNTER — Encounter (HOSPITAL_BASED_OUTPATIENT_CLINIC_OR_DEPARTMENT_OTHER): Payer: Self-pay | Admitting: Orthopedic Surgery

## 2020-08-04 ENCOUNTER — Other Ambulatory Visit: Payer: Self-pay

## 2020-08-04 NOTE — Progress Notes (Addendum)
Spoke w/ via phone for pre-op interview---pt Lab needs dos----    Urine poct  , I stat 8         Lab results------none COVID test ------08-05-2020 at 900 am Arrive at -------845 am 08-06-2020 NPO after MN NO Solid Food.  Clear liquids from MN until---745 am then npo Medications to take morning of surgery -----none Diabetic medication -----n/a Patient Special Instructions -----none Pre-Op special Istructions -----none Patient verbalized understanding of instructions that were given at this phone interview. Patient denies shortness of breath, chest pain, fever, cough at this phone interview.  ckd lov dr Celso Sickle 12-24-2018 care everywhere/chart

## 2020-08-05 ENCOUNTER — Other Ambulatory Visit (HOSPITAL_COMMUNITY)
Admission: RE | Admit: 2020-08-05 | Discharge: 2020-08-05 | Disposition: A | Discharge status: Home / Self Care | Payer: Medicaid Other | Source: Ambulatory Visit | Attending: Orthopedic Surgery | Admitting: Orthopedic Surgery

## 2020-08-05 DIAGNOSIS — Z01818 Encounter for other preprocedural examination: Secondary | ICD-10-CM | POA: Diagnosis not present

## 2020-08-05 DIAGNOSIS — Z20822 Contact with and (suspected) exposure to covid-19: Secondary | ICD-10-CM | POA: Insufficient documentation

## 2020-08-05 LAB — SARS CORONAVIRUS 2 (TAT 6-24 HRS): SARS Coronavirus 2: NEGATIVE

## 2020-08-05 NOTE — Anesthesia Preprocedure Evaluation (Addendum)
Anesthesia Evaluation  Patient identified by MRN, date of birth, ID band Patient awake    Reviewed: Allergy & Precautions, NPO status , Patient's Chart, lab work & pertinent test results  Airway Mallampati: II  TM Distance: >3 FB Neck ROM: Full    Dental no notable dental hx. (+) Teeth Intact, Dental Advisory Given   Pulmonary neg pulmonary ROS,    Pulmonary exam normal breath sounds clear to auscultation       Cardiovascular negative cardio ROS Normal cardiovascular exam Rhythm:Regular Rate:Normal     Neuro/Psych  Headaches, PSYCHIATRIC DISORDERS Anxiety    GI/Hepatic Neg liver ROS, GERD  ,  Endo/Other  negative endocrine ROS  Renal/GU      Musculoskeletal negative musculoskeletal ROS (+)   Abdominal   Peds  Hematology negative hematology ROS (+)   Anesthesia Other Findings   Reproductive/Obstetrics negative OB ROS                            Anesthesia Physical Anesthesia Plan  ASA: I  Anesthesia Plan: Regional   Post-op Pain Management:    Induction:   PONV Risk Score and Plan: Propofol infusion, Ondansetron and Treatment may vary due to age or medical condition  Airway Management Planned: Natural Airway  Additional Equipment: None  Intra-op Plan:   Post-operative Plan:   Informed Consent: I have reviewed the patients History and Physical, chart, labs and discussed the procedure including the risks, benefits and alternatives for the proposed anesthesia with the patient or authorized representative who has indicated his/her understanding and acceptance.     Dental advisory given  Plan Discussed with: CRNA  Anesthesia Plan Comments: (L supraclavicular block w MAC)       Anesthesia Quick Evaluation

## 2020-08-06 ENCOUNTER — Encounter (HOSPITAL_BASED_OUTPATIENT_CLINIC_OR_DEPARTMENT_OTHER)
Admission: RE | Disposition: A | Discharge status: Home / Self Care | Payer: Self-pay | Source: Home / Self Care | Attending: Orthopedic Surgery

## 2020-08-06 ENCOUNTER — Encounter (HOSPITAL_BASED_OUTPATIENT_CLINIC_OR_DEPARTMENT_OTHER): Payer: Self-pay | Admitting: Orthopedic Surgery

## 2020-08-06 ENCOUNTER — Ambulatory Visit (HOSPITAL_BASED_OUTPATIENT_CLINIC_OR_DEPARTMENT_OTHER): Payer: Medicaid Other | Admitting: Anesthesiology

## 2020-08-06 ENCOUNTER — Ambulatory Visit (HOSPITAL_BASED_OUTPATIENT_CLINIC_OR_DEPARTMENT_OTHER)
Admission: RE | Admit: 2020-08-06 | Discharge: 2020-08-06 | Disposition: A | Discharge status: Home / Self Care | Payer: Medicaid Other | Attending: Orthopedic Surgery | Admitting: Orthopedic Surgery

## 2020-08-06 DIAGNOSIS — S61313S Laceration without foreign body of left middle finger with damage to nail, sequela: Secondary | ICD-10-CM | POA: Insufficient documentation

## 2020-08-06 DIAGNOSIS — Z79899 Other long term (current) drug therapy: Secondary | ICD-10-CM | POA: Insufficient documentation

## 2020-08-06 DIAGNOSIS — G43909 Migraine, unspecified, not intractable, without status migrainosus: Secondary | ICD-10-CM | POA: Insufficient documentation

## 2020-08-06 DIAGNOSIS — L608 Other nail disorders: Secondary | ICD-10-CM | POA: Diagnosis present

## 2020-08-06 DIAGNOSIS — Z8249 Family history of ischemic heart disease and other diseases of the circulatory system: Secondary | ICD-10-CM | POA: Insufficient documentation

## 2020-08-06 DIAGNOSIS — W540XXS Bitten by dog, sequela: Secondary | ICD-10-CM | POA: Insufficient documentation

## 2020-08-06 DIAGNOSIS — M86642 Other chronic osteomyelitis, left hand: Secondary | ICD-10-CM | POA: Insufficient documentation

## 2020-08-06 DIAGNOSIS — N289 Disorder of kidney and ureter, unspecified: Secondary | ICD-10-CM | POA: Insufficient documentation

## 2020-08-06 DIAGNOSIS — K219 Gastro-esophageal reflux disease without esophagitis: Secondary | ICD-10-CM | POA: Diagnosis not present

## 2020-08-06 DIAGNOSIS — F419 Anxiety disorder, unspecified: Secondary | ICD-10-CM | POA: Insufficient documentation

## 2020-08-06 DIAGNOSIS — M25642 Stiffness of left hand, not elsewhere classified: Secondary | ICD-10-CM | POA: Diagnosis not present

## 2020-08-06 HISTORY — PX: AMPUTATION: SHX166

## 2020-08-06 HISTORY — DX: Gastro-esophageal reflux disease without esophagitis: K21.9

## 2020-08-06 LAB — POCT I-STAT, CHEM 8
BUN: 11 mg/dL (ref 6–20)
Calcium, Ion: 1.19 mmol/L (ref 1.15–1.40)
Chloride: 105 mmol/L (ref 98–111)
Creatinine, Ser: 0.5 mg/dL (ref 0.44–1.00)
Glucose, Bld: 107 mg/dL — ABNORMAL HIGH (ref 70–99)
HCT: 40 % (ref 36.0–46.0)
Hemoglobin: 13.6 g/dL (ref 12.0–15.0)
Potassium: 4.8 mmol/L (ref 3.5–5.1)
Sodium: 139 mmol/L (ref 135–145)
TCO2: 24 mmol/L (ref 22–32)

## 2020-08-06 LAB — POCT PREGNANCY, URINE: Preg Test, Ur: NEGATIVE

## 2020-08-06 SURGERY — AMPUTATION DIGIT
Anesthesia: Regional | Site: Finger | Laterality: Left

## 2020-08-06 MED ORDER — PROPOFOL 500 MG/50ML IV EMUL
INTRAVENOUS | Status: DC | PRN
  Administered 2020-08-06: 50 ug/kg/min via INTRAVENOUS

## 2020-08-06 MED ORDER — PROPOFOL 10 MG/ML IV BOLUS
INTRAVENOUS | Status: AC
  Filled 2020-08-06: qty 20

## 2020-08-06 MED ORDER — MIDAZOLAM HCL 2 MG/2ML IJ SOLN
INTRAMUSCULAR | Status: AC
  Filled 2020-08-06: qty 2

## 2020-08-06 MED ORDER — ONDANSETRON HCL 4 MG/2ML IJ SOLN: 4.0000 mg | Freq: Once | INTRAMUSCULAR | Status: DC | PRN

## 2020-08-06 MED ORDER — LIDOCAINE 2% (20 MG/ML) 5 ML SYRINGE
INTRAMUSCULAR | Status: AC
  Filled 2020-08-06: qty 5

## 2020-08-06 MED ORDER — ACETAMINOPHEN 10 MG/ML IV SOLN: 1000.0000 mg | Freq: Once | INTRAVENOUS | Status: DC | PRN

## 2020-08-06 MED ORDER — LIDOCAINE 2% (20 MG/ML) 5 ML SYRINGE
INTRAMUSCULAR | Status: DC | PRN
  Administered 2020-08-06: 20 mg via INTRAVENOUS

## 2020-08-06 MED ORDER — HYDROCODONE-ACETAMINOPHEN 5-325 MG PO TABS
1.0000 | ORAL_TABLET | Freq: Four times a day (QID) | ORAL | 0 refills | Status: AC | PRN
Start: 2020-08-06 — End: 2021-08-06

## 2020-08-06 MED ORDER — SODIUM CHLORIDE 0.9 % IR SOLN
Status: DC | PRN
  Administered 2020-08-06: 50 mL

## 2020-08-06 MED ORDER — ONDANSETRON HCL 4 MG/2ML IJ SOLN
INTRAMUSCULAR | Status: DC | PRN
  Administered 2020-08-06: 4 mg via INTRAVENOUS

## 2020-08-06 MED ORDER — FENTANYL CITRATE (PF) 100 MCG/2ML IJ SOLN: 25.0000 ug | INTRAMUSCULAR | Status: DC | PRN

## 2020-08-06 MED ORDER — PROPOFOL 10 MG/ML IV BOLUS
INTRAVENOUS | Status: DC | PRN
  Administered 2020-08-06: 10 mg via INTRAVENOUS
  Administered 2020-08-06 (×2): 20 mg via INTRAVENOUS

## 2020-08-06 MED ORDER — SODIUM CHLORIDE 0.9 % IV SOLN: INTRAVENOUS | Status: DC

## 2020-08-06 MED ORDER — CEFAZOLIN SODIUM-DEXTROSE 2-4 GM/100ML-% IV SOLN
2.0000 g | INTRAVENOUS | Status: AC
  Administered 2020-08-06: 2 g via INTRAVENOUS

## 2020-08-06 MED ORDER — ONDANSETRON 4 MG PO TBDP: 4.0000 mg | ORAL_TABLET | Freq: Three times a day (TID) | ORAL | 0 refills | Status: DC | PRN

## 2020-08-06 MED ORDER — CEFAZOLIN SODIUM-DEXTROSE 2-4 GM/100ML-% IV SOLN
INTRAVENOUS | Status: AC
  Filled 2020-08-06: qty 100

## 2020-08-06 MED ORDER — MIDAZOLAM HCL 2 MG/2ML IJ SOLN
2.0000 mg | Freq: Once | INTRAMUSCULAR | Status: AC
  Administered 2020-08-06: 2 mg via INTRAVENOUS

## 2020-08-06 MED ORDER — FENTANYL CITRATE (PF) 100 MCG/2ML IJ SOLN
INTRAMUSCULAR | Status: AC
  Filled 2020-08-06: qty 2

## 2020-08-06 MED ORDER — PROPOFOL 10 MG/ML IV BOLUS
INTRAVENOUS | Status: AC
  Filled 2020-08-06: qty 40

## 2020-08-06 MED ORDER — FENTANYL CITRATE (PF) 100 MCG/2ML IJ SOLN
100.0000 ug | Freq: Once | INTRAMUSCULAR | Status: AC
  Administered 2020-08-06: 100 ug via INTRAVENOUS

## 2020-08-06 SURGICAL SUPPLY — 43 items
BLADE SURG 15 STRL LF DISP TIS (BLADE) ×1 IMPLANT
BLADE SURG 15 STRL SS (BLADE) ×3
BNDG CMPR 9X4 STRL LF SNTH (GAUZE/BANDAGES/DRESSINGS)
BNDG COHESIVE 1X5 TAN NS LF (GAUZE/BANDAGES/DRESSINGS) ×2 IMPLANT
BNDG CONFORM 2 STRL LF (GAUZE/BANDAGES/DRESSINGS) ×2 IMPLANT
BNDG CONFORM 3 STRL LF (GAUZE/BANDAGES/DRESSINGS) ×1 IMPLANT
BNDG ESMARK 4X9 LF (GAUZE/BANDAGES/DRESSINGS) ×1 IMPLANT
CORD BIPOLAR FORCEPS 12FT (ELECTRODE) ×1 IMPLANT
COVER BACK TABLE 60X90IN (DRAPES) ×3 IMPLANT
COVER WAND RF STERILE (DRAPES) ×3 IMPLANT
CUFF TOURN SGL QUICK 18X4 (TOURNIQUET CUFF) ×1 IMPLANT
DECANTER SPIKE VIAL GLASS SM (MISCELLANEOUS) ×1 IMPLANT
DRAIN PENROSE 0.25X18 (DRAIN) ×2 IMPLANT
DRAPE EXTREMITY T 121X128X90 (DISPOSABLE) ×3 IMPLANT
DRAPE SHEET LG 3/4 BI-LAMINATE (DRAPES) ×3 IMPLANT
DRAPE SURG 17X23 STRL (DRAPES) ×1 IMPLANT
DRSG EMULSION OIL 3X3 NADH (GAUZE/BANDAGES/DRESSINGS) ×2 IMPLANT
DURAPREP 26ML APPLICATOR (WOUND CARE) ×3 IMPLANT
ELECT REM PT RETURN 9FT ADLT (ELECTROSURGICAL) ×3
ELECTRODE REM PT RTRN 9FT ADLT (ELECTROSURGICAL) ×1 IMPLANT
GAUZE SPONGE 4X4 12PLY STRL (GAUZE/BANDAGES/DRESSINGS) ×6 IMPLANT
GAUZE SPONGE 4X4 12PLY STRL LF (GAUZE/BANDAGES/DRESSINGS) IMPLANT
GAUZE XEROFORM 1X8 LF (GAUZE/BANDAGES/DRESSINGS) ×1 IMPLANT
GLOVE BIO SURGEON STRL SZ7.5 (GLOVE) ×3 IMPLANT
GLOVE BIOGEL PI IND STRL 7.5 (GLOVE) IMPLANT
GLOVE BIOGEL PI IND STRL 8 (GLOVE) IMPLANT
GLOVE BIOGEL PI INDICATOR 7.5 (GLOVE) ×2
GLOVE BIOGEL PI INDICATOR 8 (GLOVE) ×2
GLOVE ECLIPSE 7.5 STRL STRAW (GLOVE) ×2 IMPLANT
GOWN STRL REUS W/TWL LRG LVL3 (GOWN DISPOSABLE) ×2 IMPLANT
GOWN STRL REUS W/TWL XL LVL3 (GOWN DISPOSABLE) ×5 IMPLANT
NEEDLE HYPO 22GX1.5 SAFETY (NEEDLE) ×1 IMPLANT
NS IRRIG 500ML POUR BTL (IV SOLUTION) ×3 IMPLANT
PACK BASIN DAY SURGERY FS (CUSTOM PROCEDURE TRAY) ×3 IMPLANT
PAD CAST 3X4 CTTN HI CHSV (CAST SUPPLIES) ×1 IMPLANT
PADDING CAST COTTON 3X4 STRL (CAST SUPPLIES) ×3
STOCKINETTE 4X48 STRL (DRAPES) ×3 IMPLANT
SUT ETHILON 3 0 PS 1 (SUTURE) ×1 IMPLANT
SUT ETHILON 4 0 PS 2 18 (SUTURE) ×2 IMPLANT
SYR BULB EAR ULCER 3OZ GRN STR (SYRINGE) ×1 IMPLANT
SYR CONTROL 10ML LL (SYRINGE) ×1 IMPLANT
TOWEL OR 17X26 10 PK STRL BLUE (TOWEL DISPOSABLE) ×4 IMPLANT
WATER STERILE IRR 500ML POUR (IV SOLUTION) IMPLANT

## 2020-08-06 NOTE — Transfer of Care (Signed)
Immediate Anesthesia Transfer of Care Note  Patient: Debbie Jackson  Procedure(s) Performed: Left long finger revision amputation (Left Finger)  Patient Location: PACU  Anesthesia Type:MAC combined with regional for post-op pain  Level of Consciousness: drowsy and patient cooperative  Airway & Oxygen Therapy: Patient Spontanous Breathing and Patient connected to face mask oxygen  Post-op Assessment: Report given to RN and Post -op Vital signs reviewed and stable  Post vital signs: Reviewed and stable  Last Vitals:  Vitals Value Taken Time  BP 107/67 08/06/20 1104  Temp    Pulse 69 08/06/20 1106  Resp 17 08/06/20 1106  SpO2 98 % 08/06/20 1106  Vitals shown include unvalidated device data.  Last Pain:  Vitals:   08/06/20 1010  TempSrc:   PainSc: 0-No pain      Patients Stated Pain Goal: 6 (73/53/29 9242)  Complications: No complications documented.

## 2020-08-06 NOTE — Discharge Instructions (Signed)
Regional Anesthesia Blocks  1. Numbness or the inability to move the "blocked" extremity may last from 3-48 hours after placement. The length of time depends on the medication injected and your individual response to the medication. If the numbness is not going away after 48 hours, call your surgeon.  2. The extremity that is blocked will need to be protected until the numbness is gone and the  Strength has returned. Because you cannot feel it, you will need to take extra care to avoid injury. Because it may be weak, you may have difficulty moving it or using it. You may not know what position it is in without looking at it while the block is in effect.  3. For blocks in the legs and feet, returning to weight bearing and walking needs to be done carefully. You will need to wait until the numbness is entirely gone and the strength has returned. You should be able to move your leg and foot normally before you try and bear weight or walk. You will need someone to be with you when you first try to ensure you do not fall and possibly risk injury.  4. Bruising and tenderness at the needle site are common side effects and will resolve in a few days.  5. Persistent numbness or new problems with movement should be communicated to the surgeon or the [facility] [phone] [facility] [phone]). Post Anesthesia Home Care Instructions  Activity: Get plenty of rest for the remainder of the day. A responsible adult should stay with you for 24 hours following the procedure.  For the next 24 hours, DO NOT: -Drive a car -Advertising copywriter -Drink alcoholic beverages -Take any medication unless instructed by your physician -Make any legal decisions or sign important papers.  Meals: Start with liquid foods such as gelatin or soup. Progress to regular foods as tolerated. Avoid greasy, spicy, heavy foods. If nausea and/or vomiting occur, drink only clear liquids until the nausea  and/or vomiting subsides. Call your physician if vomiting continues.  Special Instructions/Symptoms: Your throat may feel dry or sore from the anesthesia or the breathing tube placed in your throat during surgery. If this causes discomfort, gargle with warm salt water. The discomfort should disappear within 24 hours.  If you had a scopolamine patch placed behind your ear for the management of post- operative nausea and/or vomiting:  1. The medication in the patch is effective for 72 hours, after which it should be removed.  Wrap patch in a tissue and discard in the trash. Wash hands thoroughly with soap and water. 2. You may remove the patch earlier than 72 hours if you experience unpleasant side effects which may include dry mouth, dizziness or visual disturbances. 3. Avoid touching the patch. Wash your hands with soap and water after contact with the patch.   -Maintain your postoperative bandage until your follow-up appointment.  If this become soiled or saturated you may remove and replace with a dry bandage.  -Do not get the bandage wet while it is in place.  -You may perform gentle range of motion as able with the dressing in place.  No lifting with the left arm.  -Keep the left hand elevated with your "hand above heart."  -For mild to moderate pain use Tylenol and Advil as necessary.  For breakthrough pain use oxycodone as needed.  -Return to see Dr. Aundria Rud in 2 weeks postoperatively.

## 2020-08-06 NOTE — Anesthesia Procedure Notes (Addendum)
Anesthesia Regional Block: Supraclavicular block   Pre-Anesthetic Checklist: ,, timeout performed, Correct Patient, Correct Site, Correct Laterality, Correct Procedure, Correct Position, site marked, Risks and benefits discussed,  Surgical consent,  Pre-op evaluation,  At surgeon's request and post-op pain management  Laterality: Left  Prep: chloraprep       Needles:  Injection technique: Single-shot  Needle Type: Echogenic Needle     Needle Length: 5cm  Needle Gauge: 21     Additional Needles:   Procedures:,,,, ultrasound used (permanent image in chart),,,,  Narrative:  Start time: 08/06/2020 9:53 AM End time: 08/06/2020 9:59 AM Injection made incrementally with aspirations every 5 mL.  Performed by: Personally  Anesthesiologist: Trevor Iha, MD

## 2020-08-06 NOTE — Anesthesia Postprocedure Evaluation (Signed)
Anesthesia Post Note  Patient: Debbie Jackson  Procedure(s) Performed: Left long finger revision amputation (Left Finger)     Patient location during evaluation: PACU Anesthesia Type: Regional Level of consciousness: awake and alert Pain management: pain level controlled Vital Signs Assessment: post-procedure vital signs reviewed and stable Respiratory status: spontaneous breathing, nonlabored ventilation, respiratory function stable and patient connected to nasal cannula oxygen Cardiovascular status: stable and blood pressure returned to baseline Postop Assessment: no apparent nausea or vomiting Anesthetic complications: no   No complications documented.  Last Vitals:  Vitals:   08/06/20 1124 08/06/20 1130  BP:  108/74  Pulse: 66 70  Resp: 17 17  Temp:    SpO2: 96% 96%    Last Pain:  Vitals:   08/06/20 1113  TempSrc: Oral  PainSc:                  Trevor Iha

## 2020-08-06 NOTE — Brief Op Note (Signed)
08/06/2020  11:04 AM  PATIENT:  Debbie Jackson  34 y.o. female  PRE-OPERATIVE DIAGNOSIS:  Left long ring finger necrosis  POST-OPERATIVE DIAGNOSIS:  Left long ring finger necrosis  PROCEDURE:  Procedure(s): Left long finger revision amputation (Left)  SURGEON:  Surgeon(s) and Role:    * Yolonda Kida, MD - Primary  PHYSICIAN ASSISTANT: Dion Saucier, PA-C  ANESTHESIA:   regional  EBL:  2 mL   BLOOD ADMINISTERED:none  DRAINS: none   LOCAL MEDICATIONS USED:  NONE  SPECIMEN:  No Specimen  DISPOSITION OF SPECIMEN:  N/A  COUNTS:  YES  TOURNIQUET:  12 minutes finger tourniquet  DICTATION: .Note written in EPIC  PLAN OF CARE: Discharge to home after PACU  PATIENT DISPOSITION:  PACU - hemodynamically stable.   Delay start of Pharmacological VTE agent (>24hrs) due to surgical blood loss or risk of bleeding: not applicable

## 2020-08-06 NOTE — Op Note (Signed)
Date of Surgery: 08/06/2020  INDICATIONS: Debbie Jackson is a 34 y.o.-year-old female with a left long finger nail bed injury and distal phalanx malunion;  The patient did consent to the procedure after discussion of the risks and benefits.  PREOPERATIVE DIAGNOSIS:  1.  Left hand long finger nailbed injury 2.  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  Left hand long finger likely chronic osteomyelitis   POSTOPERATIVE DIAGNOSIS: Same.  PROCEDURE:  1.  Amputation of left hand long finger through distal interphalangeal joint  SURGEON: Maryan Rued, M.D.  ASSIST:  Dion Saucier, PA-C.  Present during the entire procedure and assisted with the critical portions including positioning the patient and exposure of deep tissues for amputation.  Also assisting closure and transported back to PACU.Marland Kitchen  ANESTHESIA: Supraclavicular regional anesthetic  IV FLUIDS AND URINE: See anesthesia.  ESTIMATED BLOOD LOSS: 2 cc mL.  IMPLANTS: None  DRAINS: None  Finger tourniquet placed for 12 minutes  COMPLICATIONS: None.  DESCRIPTION OF PROCEDURE: The patient was brought to the operating room and placed supine on the operating table.  The patient had been signed prior to the procedure and this was documented. The patient had the anesthesia placed by the anesthesiologist.  A time-out was performed to confirm that this was the correct patient, site, side and location. The patient did receive antibiotics prior to the incision and was re-dosed during the procedure as needed at indicated intervals.  A tourniquet used on the base of the left long finger placed.  The patient had the operative extremity prepped and draped in the standard surgical fashion.     A circumferential incision was established at the distal interphalangeal joint of the left long finger.  We then sharply dissected down to the volar plate.  The collateral ligaments were released in line with the joint.  We then excised the dorsal  surfaces in line with the incision.  This completed the disarticulation of the distal phalangeal joint.  We then utilized rondure and rasp to smooth the distal aspect of the middle phalanx of the long finger.  Following that we copiously irrigated the incision and wound with normal saline.  We then closed the incision with 3-0 nylon.  Standard sterile dressing was applied to the long finger and left hand.  All counts were correct x2.  Patient tolerated the procedure well with no complications.  POSTOPERATIVE PLAN:  She will be nonweightbearing to the left hand.  She can begin gentle range of motion immediately.  She will return to the office to see me in 2 weeks postoperatively.

## 2020-08-06 NOTE — Progress Notes (Signed)
Assisted Dr. Houser with left, ultrasound guided, supraclavicular block. Side rails up, monitors on throughout procedure. See vital signs in flow sheet. Tolerated Procedure well. °

## 2020-08-06 NOTE — H&P (Signed)
ORTHOPAEDIC H and P  REQUESTING PHYSICIAN: Yolonda Kida, MD  PCP:  Patient, No Pcp Per  Chief Complaint: Left long finger pain  HPI: Debbie Jackson is a 34 y.o. female who complains of left long finger pain and some persistent drainage. She has been recovering from a salvage procedure on the left long finger following a dog bite injury back in June of this year. We did perform open repairs as well as fixation of the distal phalanx. She did go on to union. However, has developed some persistent nailbed deformity as well as some pretty significant stiffness of the DIP joint. There is also some a sensate area on the ulnar aspect. She is opted for revision amputation of the long finger. No new complaints at this time.  Past Medical History:  Diagnosis Date  . GERD (gastroesophageal reflux disease)   . Migraine   . Renal disorder    since childhood    Past Surgical History:  Procedure Laterality Date  . 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   Procedure: Irrigation and debridement, complex repair of left long finger with primary closure, open reduction and percutaneous pinning, and nail bed repair, left long finger.;  Surgeon: Yolonda Kida, MD;  Location: [facility] OR;  Service: Orthopedics;  Laterality: Left;  . ECTOPIC PREGNANCY SURGERY  yrs ago   right tube removed after burst tubal pregnancy  . KIDNEY SURGERY     multiple kidney surgeries, born with double kidney  . left finger necrosis     Social History   Socioeconomic History  . Marital status: Single    Spouse name: Not on file  . Number of children: Not on file  . Years of education: Not on file  . Highest education level: Not on file  Occupational History  . Not on file  Tobacco Use  . Smoking status: Never Smoker  . Smokeless tobacco: Never Used  Vaping Use  . Vaping Use: Never used  Substance and Sexual Activity  . Alcohol use: No  . Drug use: No  . Sexual activity: Yes    Birth control/protection: None    Other Topics Concern  . Not on file  Social History Narrative  . Not on file   Social Determinants of Health   Financial Resource Strain:   . Difficulty of Paying Living Expenses: Not on file  Food Insecurity:   . Worried About Programme researcher, broadcasting/film/video in the Last Year: Not on file  . Ran Out of Food in the Last Year: Not on file  Transportation Needs:   . Lack of Transportation (Medical): Not on file  . Lack of Transportation (Non-Medical): Not on file  Physical Activity:   . Days of Exercise per Week: Not on file  . Minutes of Exercise per Session: Not on file  Stress:   . Feeling of Stress : Not on file  Social Connections:   . Frequency of Communication with Friends and Family: Not on file  . Frequency of Social Gatherings with Friends and Family: Not on file  . Attends Religious Services: Not on file  . Active Member of Clubs or Organizations: Not on file  . Attends Banker Meetings: Not on file  . Marital Status: Not on file   Family History  Problem Relation Age of Onset  . Hypertension Mother   . Hypertension Father   . Alcohol abuse Neg Hx   . Arthritis Neg Hx   . Asthma Neg Hx   .  Birth defects Neg Hx   . Cancer Neg Hx   . COPD Neg Hx   . Depression Neg Hx   . Diabetes Neg Hx   . Drug abuse Neg Hx   . Early death Neg Hx   . Hearing loss Neg Hx   . Heart disease Neg Hx   . Hyperlipidemia Neg Hx   . Kidney disease Neg Hx   . Learning disabilities Neg Hx   . Mental illness Neg Hx   . Mental retardation Neg Hx   . Miscarriages / Stillbirths Neg Hx   . Stroke Neg Hx   . Vision loss Neg Hx   . Varicose Veins Neg Hx    No Known Allergies Prior to Admission medications   Medication Sig Start Date End Date Taking? Authorizing Provider  cetirizine (ZYRTEC) 10 MG tablet Take 10 mg by mouth every evening.    Yes [provider]  DULoxetine (CYMBALTA) 60 MG capsule Take 60 mg by mouth every evening.  01/14/20 01/13/21 Yes [provider]  NIKKI 3-0.02 MG tablet Take 1 tablet by mouth every evening.  07/16/17  Yes [provider]   No results found.  Positive ROS: All other systems have been reviewed and were otherwise negative with the exception of those mentioned in the HPI and as above.  Physical Exam: General: Alert, no acute distress Cardiovascular: No pedal edema Respiratory: No cyanosis, no use of accessory musculature GI: No organomegaly, abdomen is soft and non-tender Skin: No lesions in the area of chief complaint Neurologic: Sensation intact distally Psychiatric: Patient is competent for consent with normal mood and affect Lymphatic: No axillary or cervical lymphadenopathy  MUSCULOSKELETAL:  Left hand:  There is stable appearing eschar on the nail bed of the long finger. No obvious drainage. No concerning findings of active infection.  Assessment: 1. 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 We may have to go more proximal if the distal phalanx appears involved in any way. Also we will need to have good skin coverage. She is in agreement with that plan. We will plan for discharge home postoperatively from PACU.  -Otherwise she will follow-up with me postoperatively in about 2 weeks.  -We did review the risk benefits of this procedure at length. We discussed the risk of bleeding, infection, damage to surrounding nerves and vessels, persistent pain and stiffness, and the risk of anesthesia. She has provided informed consent.    Yolonda Kida, MD Cell [phone]    08/06/2020 9:42 AM

## 2020-08-07 ENCOUNTER — Encounter (HOSPITAL_BASED_OUTPATIENT_CLINIC_OR_DEPARTMENT_OTHER): Payer: Self-pay | Admitting: Orthopedic Surgery

## 2020-10-23 ENCOUNTER — Emergency Department (HOSPITAL_COMMUNITY): Payer: Medicaid Other

## 2020-10-23 ENCOUNTER — Other Ambulatory Visit: Payer: Self-pay

## 2020-10-23 ENCOUNTER — Emergency Department (HOSPITAL_COMMUNITY)
Admission: EM | Admit: 2020-10-23 | Discharge: 2020-10-23 | Disposition: A | Discharge status: Home / Self Care | Payer: Medicaid Other | Attending: Emergency Medicine | Admitting: Emergency Medicine

## 2020-10-23 ENCOUNTER — Encounter (HOSPITAL_COMMUNITY): Payer: Self-pay | Admitting: Emergency Medicine

## 2020-10-23 DIAGNOSIS — K529 Noninfective gastroenteritis and colitis, unspecified: Secondary | ICD-10-CM | POA: Diagnosis not present

## 2020-10-23 DIAGNOSIS — R1013 Epigastric pain: Secondary | ICD-10-CM | POA: Diagnosis present

## 2020-10-23 DIAGNOSIS — R0789 Other chest pain: Secondary | ICD-10-CM | POA: Insufficient documentation

## 2020-10-23 DIAGNOSIS — R112 Nausea with vomiting, unspecified: Secondary | ICD-10-CM | POA: Insufficient documentation

## 2020-10-23 LAB — URINALYSIS, ROUTINE W REFLEX MICROSCOPIC
Bilirubin Urine: NEGATIVE
Glucose, UA: NEGATIVE mg/dL
Ketones, ur: NEGATIVE mg/dL
Nitrite: NEGATIVE
Protein, ur: NEGATIVE mg/dL
Specific Gravity, Urine: 1.02 (ref 1.005–1.030)
pH: 5 (ref 5.0–8.0)

## 2020-10-23 LAB — I-STAT BETA HCG BLOOD, ED (MC, WL, AP ONLY): I-stat hCG, quantitative: <5 m[IU]/mL (ref <5)

## 2020-10-23 LAB — CBC
HCT: 42.7 % (ref 36.0–46.0)
Hemoglobin: 14 g/dL (ref 12.0–15.0)
MCH: 29.3 pg (ref 26.0–34.0)
MCHC: 32.8 g/dL (ref 30.0–36.0)
MCV: 89.3 fL (ref 80.0–100.0)
Platelets: 245 10*3/uL (ref 150–400)
RBC: 4.78 MIL/uL (ref 3.87–5.11)
RDW: 13 % (ref 11.5–15.5)
WBC: 13.1 10*3/uL — ABNORMAL HIGH (ref 4.0–10.5)
nRBC: 0 % (ref 0.0–0.2)

## 2020-10-23 LAB — COMPREHENSIVE METABOLIC PANEL
ALT: 18 U/L (ref 0–44)
AST: 16 U/L (ref 15–41)
Albumin: 3.8 g/dL (ref 3.5–5.0)
Alkaline Phosphatase: 90 U/L (ref 38–126)
Anion gap: 12 (ref 5–15)
BUN: 8 mg/dL (ref 6–20)
CO2: 22 mmol/L (ref 22–32)
Calcium: 9 mg/dL (ref 8.9–10.3)
Chloride: 105 mmol/L (ref 98–111)
Creatinine, Ser: 0.66 mg/dL (ref 0.44–1.00)
GFR, Estimated: >60 mL/min (ref >60)
Glucose, Bld: 100 mg/dL — ABNORMAL HIGH (ref 70–99)
Potassium: 3.6 mmol/L (ref 3.5–5.1)
Sodium: 139 mmol/L (ref 135–145)
Total Bilirubin: 0.8 mg/dL (ref 0.3–1.2)
Total Protein: 7.2 g/dL (ref 6.5–8.1)

## 2020-10-23 LAB — LIPASE, BLOOD: Lipase: 31 U/L (ref 11–51)

## 2020-10-23 LAB — TROPONIN I (HIGH SENSITIVITY)
Troponin I (High Sensitivity): <2 ng/L (ref <18)
Troponin I (High Sensitivity): <2 ng/L (ref <18)

## 2020-10-23 MED ORDER — ACETAMINOPHEN 500 MG PO TABS
1000.0000 mg | ORAL_TABLET | Freq: Once | ORAL | Status: AC
  Administered 2020-10-23: 1000 mg via ORAL
  Filled 2020-10-23: qty 2

## 2020-10-23 MED ORDER — ONDANSETRON HCL 4 MG PO TABS: 4.0000 mg | ORAL_TABLET | Freq: Four times a day (QID) | ORAL | 0 refills | Status: DC

## 2020-10-23 MED ORDER — IOHEXOL 300 MG/ML  SOLN
100.0000 mL | Freq: Once | INTRAMUSCULAR | Status: AC | PRN
  Administered 2020-10-23: 100 mL via INTRAVENOUS

## 2020-10-23 MED ORDER — ALUM & MAG HYDROXIDE-SIMETH 200-200-20 MG/5ML PO SUSP
15.0000 mL | Freq: Once | ORAL | Status: AC
  Administered 2020-10-23: 15 mL via ORAL
  Filled 2020-10-23: qty 30

## 2020-10-23 NOTE — ED Notes (Signed)
ED Provider at bedside. 

## 2020-10-23 NOTE — Discharge Instructions (Addendum)
You came to the emergency department today with a chief complaint of pain to the upper abdomen.  Your lab work and CT scan were reassuring and did not show signs of a heart attack, problem with your gallbladder, appendicitis, or an emergent abdominal injury.  We have diagnosed you with enteritis which is an inflammation of your small intestine.  This can cause abdominal pain, diarrhea, nausea and vomiting.    You can try following the BRAT diet  which is a bland diet consisting of bananas, rice, applesauce and toast.  Please try to increase your water intake until your symptoms resolve.  I prescribed you with medication Zofran to help with any nausea you may experience.  Please read all medication information that you get with your prescriptions to learn about the risks and benefits of your prescriptions, and possible side effects.   Please follow-up with your primary care physician if your symptoms do not improve.  Return to the emergency department if: You have chest pain. You feel extremely weak or you faint. You have bloody or black stools or stools that look like tar. You have severe pain, cramping, or bloating in your abdomen. You have trouble breathing or you are breathing very quickly. Your heart is beating very quickly. Your skin feels cold and clammy. You feel confused. You have signs of dehydration, such as: Dark urine, very little urine, or no urine. Cracked lips. Dry mouth. Sunken eyes. Sleepiness. Weakness.

## 2020-10-23 NOTE — ED Notes (Signed)
Patient in CT at this time.

## 2020-10-23 NOTE — ED Provider Notes (Addendum)
[facility] Provider Note   CSN: [id number] Arrival date & time: 10/23/20  1636     History Chief Complaint  Patient presents with  . Abdominal Pain  . Chest Pain    Debbie OdorRebecca J Jackson is a 34 y.o. female with a history of GERD, migraine, renal disorder.  Presents to the emergency department with a chief complaint of epigastric pain that began suddenly this morning at 9 AM.  Patient describes the pain as a squeezing sensation, states it is intermittent lasting approximately 10 seconds and has been worsening throughout the day.  Patient denies the pain radiates.  Patient states the pain began this morning after she drank soda.  Patient endorses nausea and one episode of vomiting; no blood or coffee-ground emesis noted.  Patient also reports some substernal chest pain.  Patient denies any shortness of breath, diaphoresis, palpitations, lightheadedness, dizziness, or syncope.  Patient's LMP was 3 weeks prior.  Patient denies any drugs or alcohol.  Patient has had previous abdominal surgery for ectopic pregnancy.  Patient still has gallbladder and appendix intact.   HPI     Past Medical History:  Diagnosis Date  . GERD (gastroesophageal reflux disease)   . Migraine   . Renal disorder    since childhood     Patient Active Problem List   Diagnosis Date Noted  . Open fracture of tuft of distal phalanx of finger 05/03/2020  . Normal labor 06/11/2015  . GBS bacteriuria 05/05/2015    Past Surgical History:  Procedure Laterality Date  . 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   Procedure: Irrigation and debridement, complex repair of left long finger with primary closure, open reduction and percutaneous pinning, and nail bed repair, left long finger.;  Surgeon: Yolonda Kidaogers, Jason Patrick, MD;  [facility];  Service: Orthopedics;  Laterality: Left;  . 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   Procedure: Left long finger revision amputation;  Surgeon: Yolonda Kidaogers, Jason Patrick, MD;   [facility];  Service: Orthopedics;  Laterality: Left;  . ECTOPIC PREGNANCY SURGERY  yrs ago   right tube removed after burst tubal pregnancy  . 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 Hypertension Mother   . Hypertension Father   . Alcohol abuse Neg Hx   . Arthritis Neg Hx   . Asthma Neg Hx   . Birth defects Neg Hx   . Cancer Neg Hx   . COPD Neg Hx   . Depression Neg Hx   . Diabetes Neg Hx   . Drug abuse Neg Hx   . Early death Neg Hx   . Hearing loss Neg Hx   . Heart disease Neg Hx   . Hyperlipidemia Neg Hx   . Kidney disease Neg Hx   . Learning disabilities Neg Hx   . Mental illness Neg Hx   . Mental retardation Neg Hx   . Miscarriages / Stillbirths Neg Hx   .  Stroke Neg Hx   . Vision loss Neg Hx   . Varicose Veins Neg Hx     Social History   Tobacco Use  . Smoking status: Never Smoker  . Smokeless tobacco: Never Used  Vaping Use  . Vaping Use: Never used  Substance Use Topics  . Alcohol use: No  . Drug use: No    Home Medications Prior to Admission medications   Medication Sig Start Date End Date Taking? Authorizing Provider  NIKKI 3-0.02 MG tablet Take 1 tablet by mouth every evening.  07/16/17  Yes [provider]  HYDROcodone-acetaminophen (NORCO/VICODIN) 5-325 MG tablet Take 1 tablet by mouth every 6 (six) hours as needed for moderate pain. Patient not taking: Reported on 10/23/2020 08/06/20 08/06/21  Yolonda Kida, MD  ondansetron (ZOFRAN ODT) 4 MG disintegrating tablet Take 1 tablet (4 mg total) by mouth every 8 (eight) hours as needed for nausea or vomiting. Patient not taking: Reported on 10/23/2020 08/06/20   Yolonda Kida,  MD  ondansetron (ZOFRAN) 4 MG tablet Take 1 tablet (4 mg total) by mouth every 6 (six) hours. 10/23/20   Haskel Schroeder, PA-C    Allergies    Patient has no known allergies.  Review of Systems   Review of Systems  Constitutional: Negative for chills and fever.  Eyes: Negative for visual disturbance.  Respiratory: Negative for shortness of breath.   Cardiovascular: Negative for chest pain and leg swelling.  Gastrointestinal: Negative for diarrhea, nausea and vomiting.  Genitourinary: Negative for dysuria.  Musculoskeletal: Negative for back pain and neck pain.  Skin: Negative for rash.  Neurological: Negative for headaches.  Psychiatric/Behavioral: Negative for confusion.    Physical Exam Updated Vital Signs BP 108/63   Pulse 90   Temp 98.9 F (37.2 C) (Oral)   Resp 16   Ht 5\' 5"  (1.651 m)   Wt 83.9 kg   LMP 10/02/2020 (Approximate)   SpO2 99%   BMI 30.79 kg/m   Physical Exam Constitutional:      General: She is not in acute distress.    Appearance: She is not ill-appearing, toxic-appearing or diaphoretic.  HENT:     Head: Normocephalic.  Cardiovascular:     Rate and Rhythm: Normal rate.  Pulmonary:     Effort: Pulmonary effort is normal.     Breath sounds: Normal breath sounds.  Abdominal:     General: There is no distension.     Palpations: Abdomen is soft.     Tenderness: There is abdominal tenderness in the right lower quadrant and epigastric area. There is no guarding or rebound.  Skin:    General: Skin is warm and dry.  Neurological:     General: No focal deficit present.     Mental Status: She is alert.  Psychiatric:        Behavior: Behavior is cooperative.     ED Results / Procedures / Treatments   Labs (all labs ordered are listed, but only abnormal results are displayed) Labs Reviewed  COMPREHENSIVE METABOLIC PANEL - Abnormal; Notable for the following components:      Result Value   Glucose, Bld 100 (*)    All other components within  normal limits  CBC - Abnormal; Notable for the following components:   WBC 13.1 (*)    All other components within normal limits  URINALYSIS, ROUTINE W REFLEX MICROSCOPIC - Abnormal; Notable for the following components:   APPearance HAZY (*)    Hgb urine dipstick SMALL (*)  Leukocytes,Ua TRACE (*)    Bacteria, UA RARE (*)    All other components within normal limits  LIPASE, BLOOD  I-STAT BETA HCG BLOOD, ED ([facility], [facility], [facility] ONLY)  TROPONIN I (HIGH SENSITIVITY)  TROPONIN I (HIGH SENSITIVITY)    EKG None  Radiology CT ABDOMEN PELVIS W CONTRAST  Result Date: 10/23/2020 CLINICAL DATA:  Nausea vomiting, epigastric pain. EXAM: CT ABDOMEN AND PELVIS WITH CONTRAST TECHNIQUE: Multidetector CT imaging of the abdomen and pelvis was performed using the standard protocol following bolus administration of intravenous contrast. CONTRAST:  OMNIPAQUE IOHEXOL 300 MG/ML  SOLN COMPARISON:  CT abdomen pelvis 07/07/2016, ultrasound renal 11/09/2016 FINDINGS: Lower chest: Bilateral lower lobe subsegmental atelectasis. Hepatobiliary: No focal liver abnormality. No gallstones, gallbladder wall thickening, or pericholecystic fluid. No biliary dilatation. Pancreas: No focal lesion. Normal pancreatic contour. No surrounding inflammatory changes. No main pancreatic ductal dilatation. Spleen: No focal liver abnormality. No gallstones, gallbladder wall thickening, or pericholecystic fluid. No biliary dilatation. Adrenals/Urinary Tract: No adrenal nodule bilaterally. Bilateral kidneys enhance symmetrically. Persistent chronic severe right hydroureteronephrosis. No left hydronephrosis. No left hydroureter. The urinary bladder is unremarkable. Stomach/Bowel: Stomach is within normal limits. Mild bowel wall thickening and submucosal enhancement of a couple loops of mid abdominal small bowel (6:60). Trace associated fat stranding. No small bowel dilatation. No pneumatosis. No evidence of large bowel wall thickening or  dilatation. Appendix appears normal. Vascular/Lymphatic: No significant vascular findings are present. No enlarged abdominal or pelvic lymph nodes. Reproductive: Uterus and bilateral adnexa are unremarkable. Other: No intraperitoneal free fluid. No intraperitoneal free gas. No organized fluid collection. Musculoskeletal: No acute or significant osseous findings. IMPRESSION: 1. Mild bowel wall thickening of a couple loops of mid abdominal small bowel. Findings could represent enteritis. Differential diagnosis for etiology includes infection, inflammation, ischemia. No associated bowel obstruction or perforation. 2. Normal appendix. 3. Chronic severe right hydroureteronephrosis. Electronically Signed   By: Tish Frederickson M.D.   On: 10/23/2020 21:39    Procedures Procedures (including critical care time)  Medications Ordered in ED Medications  alum & mag hydroxide-simeth (MAALOX/MYLANTA) 200-200-20 MG/5ML suspension 15 mL (15 mLs Oral Given 10/23/20 2048)  iohexol (OMNIPAQUE) 300 MG/ML solution 100 mL (100 mLs Intravenous Contrast Given 10/23/20 2120)  acetaminophen (TYLENOL) tablet 1,000 mg (1,000 mg Oral Given 10/23/20 2210)    ED Course  I have reviewed the triage vital signs and the nursing notes.  Pertinent labs & imaging results that were available during my care of the patient were reviewed by me and considered in my medical decision making (see chart for details).    MDM Rules/Calculators/A&P                          Patient is an alert 34 year old female in no acute distress with a history of GERD, migraine, and renal disorder.  She complains of intermittent epigastric pain described as a squeezing that has been progressively worsening throughout the day.  Patient has not been able to tolerate p.o. intake throughout the day.Patient also complains of some substernal chest pain.  Pain does not radiate.  Patient endorses nausea and one episode of vomiting; at present no vomiting.  Patient  has had abdominal surgery for ectopic pregnancy.  Patient still has gallbladder and appendix intact. Patient's LMP was 3 weeks prior.  [id number].    On physical exam patient complained of tenderness to epigastric and lower right quadrant.  Patient's pregnancy test was negative.  Troponin, CMP, lipase were  unremarkable.  CBC showed leukocytosis at 13.1.  UA showed rare bacteria, trace leukocytes; patient has no complaints of dysuria or urinary frequency and unlikely to have UTI.  Based on patient's history and troponin less than 2 unlikely ACS.  More likely patient's pain is related to an abdominal complaint.  Concern for PUD, GERD, cholelithiasis, choledocholithiasis, cholangitis, or appendicitis.  CT scan of abdomen and pelvis pending.    CT scan showed mild bowel wall thickening of a couple loops of mid abdominal small bowel which could represent enteritis, normal appendix, and chronic severe right hydroureteronephrosis.    Patient reports that she has a history of renal issues, the right hydroureteronephrosis can be seen on previous imaging and patient's creatinine is within normal limits today.  Patient vitals remained stable.  Patient is safe to be discharged home.  Patient was given a prescription for Zofran, will to follow a brat diet, and increase fluid intake.  Was given strict return precautions.     Final Clinical Impression(s) / ED Diagnoses Final diagnoses:  Enteritis    Rx / DC Orders ED Discharge Orders         Ordered    ondansetron (ZOFRAN) 4 MG tablet  Every 6 hours        10/23/20 2236           Berneice Heinrich 10/23/20 2252    Eber Hong, MD 10/24/20 1514    Haskel Schroeder, PA-C 11/02/20 1540    Eber Hong, MD 11/03/20 [phone]

## 2020-10-23 NOTE — ED Notes (Signed)
Patient ambulatory back to room, gait steady, NAD noted.

## 2020-10-23 NOTE — ED Triage Notes (Signed)
Pt to ED with c/o epigastric pain, onset this am   St's eating or drinking anything makes the pain worse.  Pt also c/o lower chest pain

## 2020-10-23 NOTE — ED Notes (Signed)
Patient ambulated to and from restroom with steady gait. NAD noted

## 2020-10-23 NOTE — ED Provider Notes (Signed)
Epigastric pain that has no radiation - came on acutely this AM, intermittent pain - no back pain - had hx of tubal.  LMP 3 weeks ago.  Some diarrhea and chills today  On exam, no tachyardia, lungs clear, ttp in the epigastric, RUQ and RLQ, abd - no guarding or peritoneal signs.  Labs with WBC of 13,000, otherwise unremarkable CT pending  Medical screening examination/treatment/procedure(s) were conducted as a shared visit with non-physician practitioner(s) and myself.  I personally evaluated the patient during the encounter.  Clinical Impression:   Final diagnoses:  Sharlynn Oliphant, MD 10/24/20 1515

## 2020-11-29 IMAGING — CT CT ANGIO CHEST
2 of 6 series · 18 of 46 positions shown · IV contrast (APPLIED)
Comparison: Current chest radiograph.

CLINICAL DATA: Chest pain since yesterday.

EXAM:
CT ANGIOGRAPHY CHEST WITH CONTRAST
TECHNIQUE: Multidetector CT imaging of the chest was performed using the
standard protocol during bolus administration of intravenous
contrast. Multiplanar CT image reconstructions and MIPs were
obtained to evaluate the vascular anatomy.
CONTRAST:  50mL OMNIPAQUE IOHEXOL 350 MG/ML SOLN

[Series 7: thins · axial · 0.61mm/px · z∈[+1007,+1227]mm · 15 of 347 slices shown]
[im 16/347  lung]
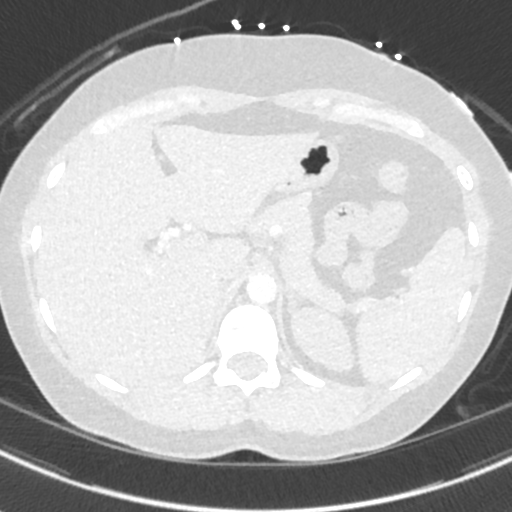
[im 46/347  soft-tissue]
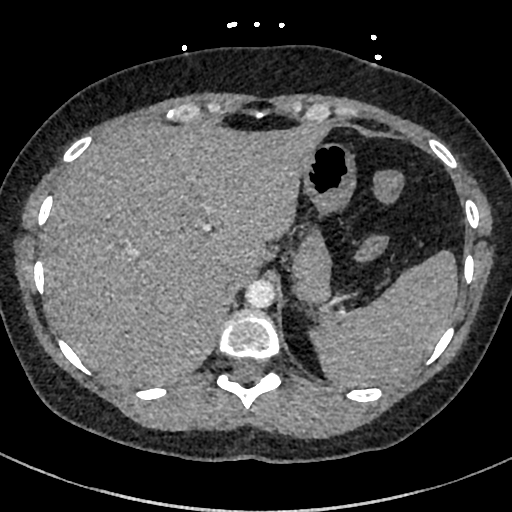
[im 61/347  lung]
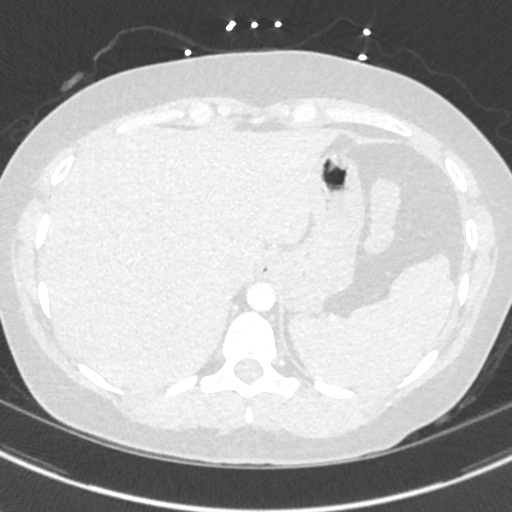
[im 91/347  soft-tissue]
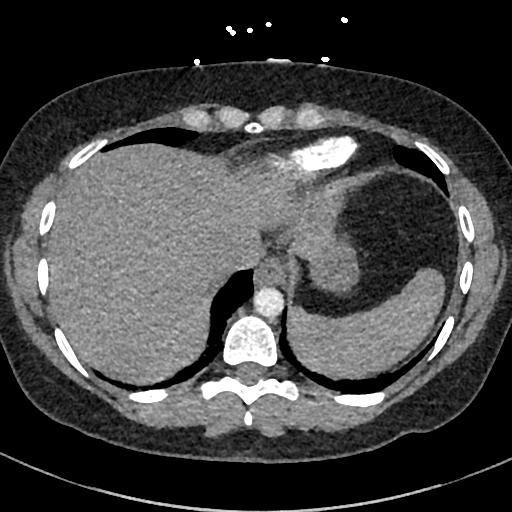
[im 106/347  lung]
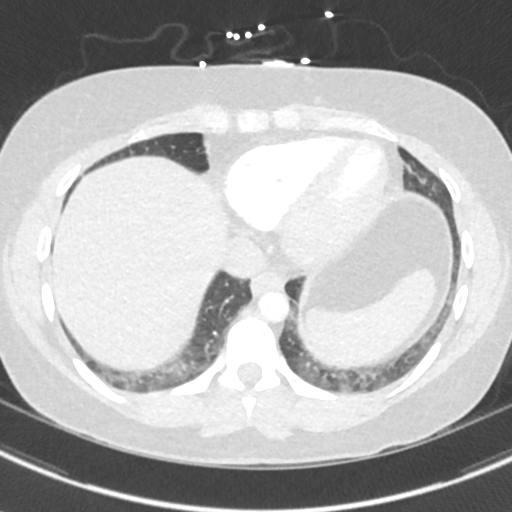
[im 136/347  soft-tissue]
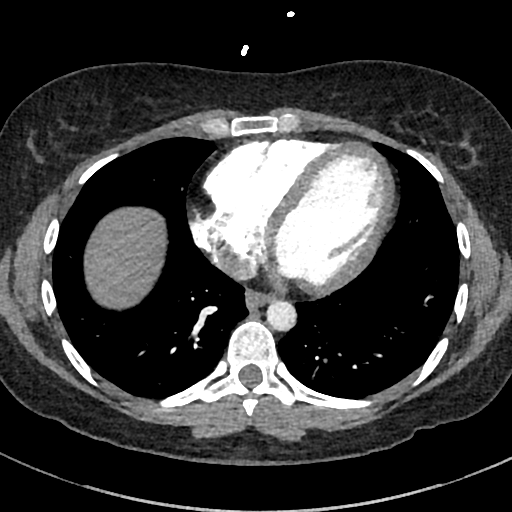
[im 151/347  lung]
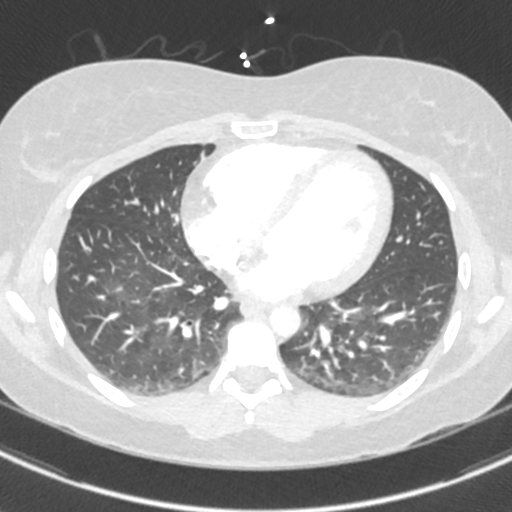
[im 181/347  soft-tissue]
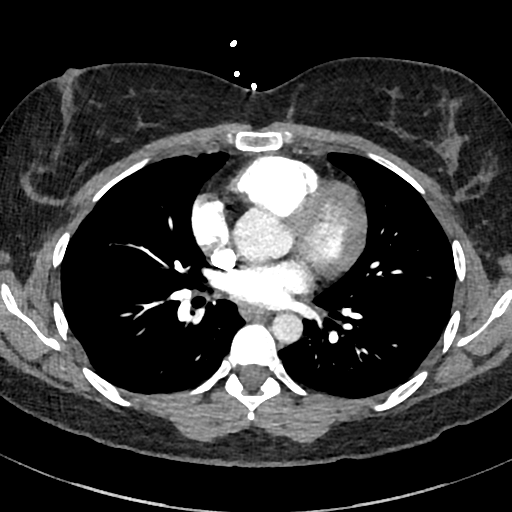
[im 196/347  lung]
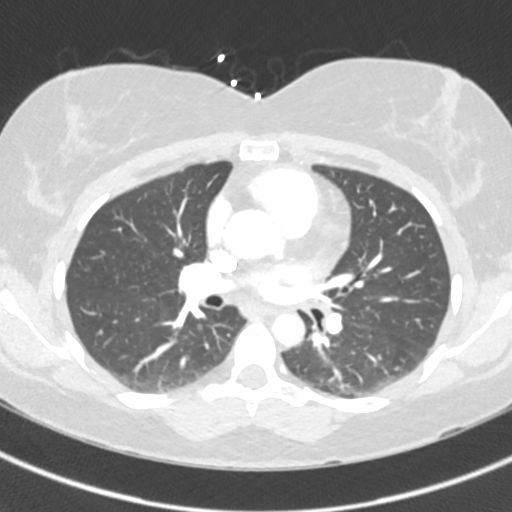
[im 211/347  soft-tissue]
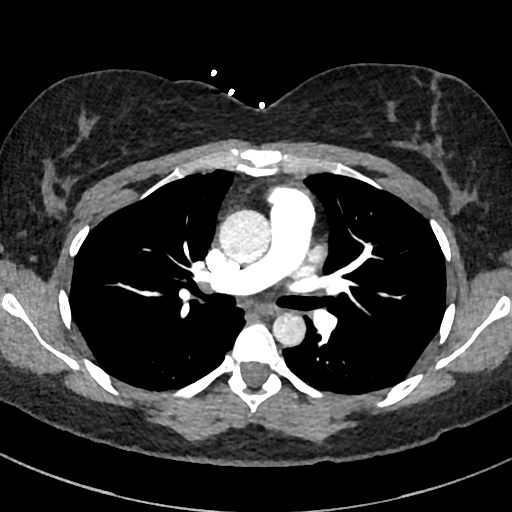
[im 241/347  lung]
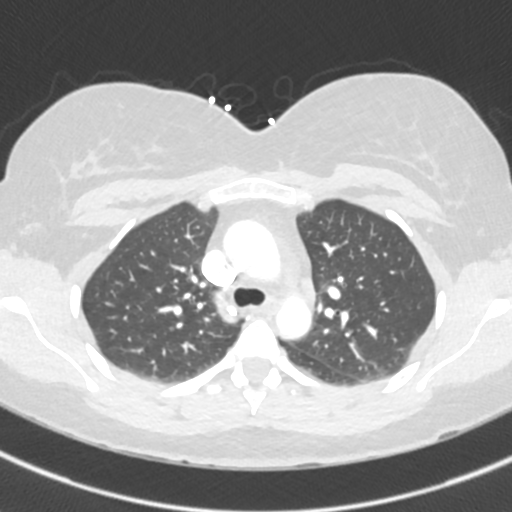
[im 256/347  soft-tissue]
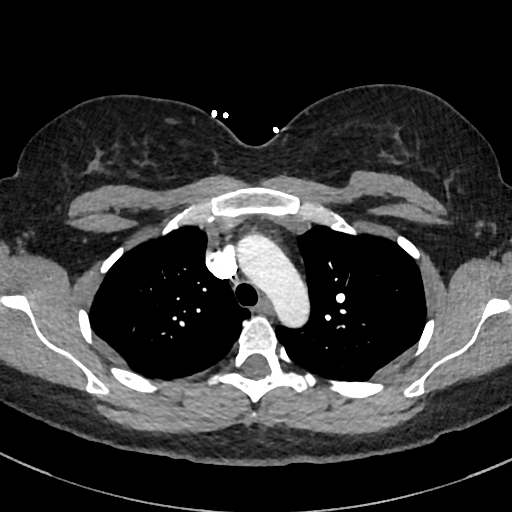
[im 286/347  lung]
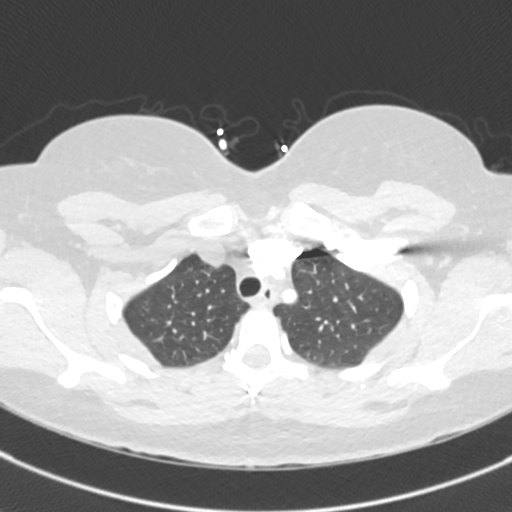
[im 301/347  soft-tissue]
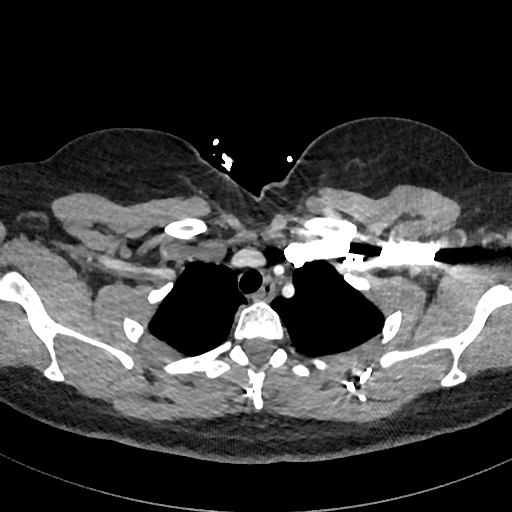
[im 331/347  lung]
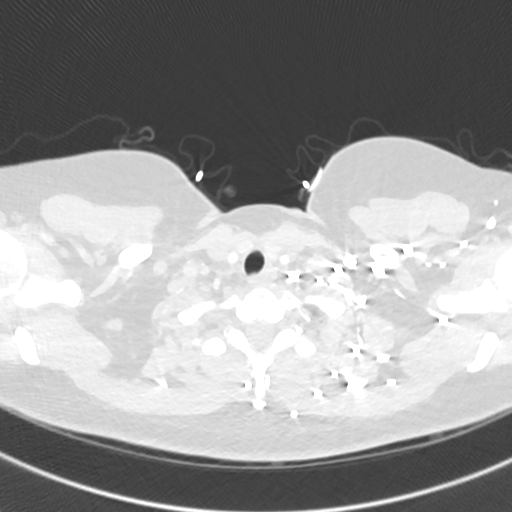

[Series 8: cor · coronal · 0.51mm/px · 3 of 102 slices shown]
[im 26/102  soft-tissue]
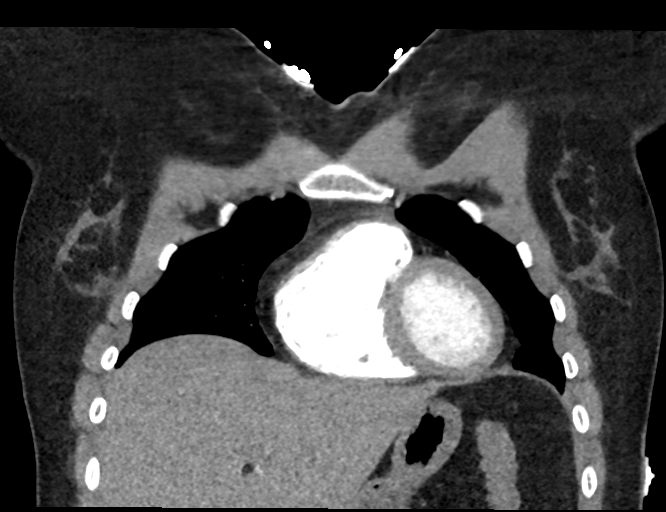
[im 51/102  soft-tissue]
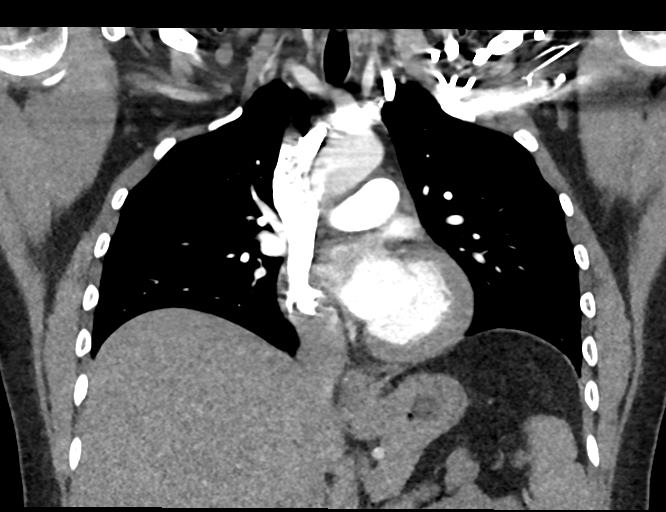
[im 76/102  soft-tissue]
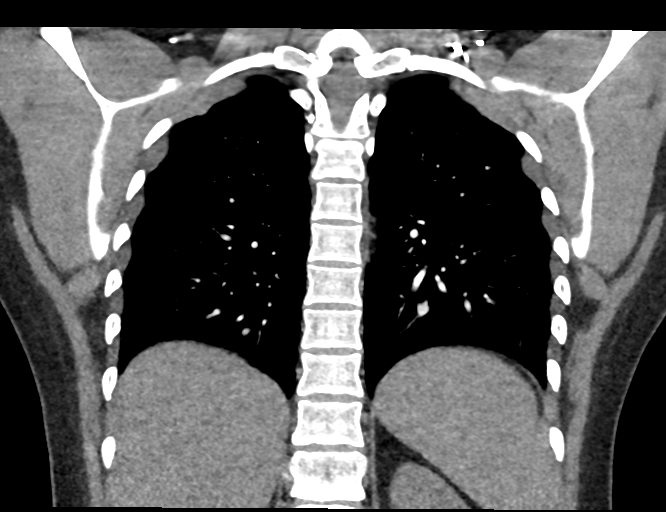

[18 of 46 positions shown; findings below may reference images not displayed]

FINDINGS: Cardiovascular: There is satisfactory opacification of the pulmonary
arteries to the segmental level. There is no evidence of a pulmonary
embolism.

Heart is normal in size and configuration. No pericardial effusion.
No coronary artery calcifications. Normal great vessels.

Mediastinum/Nodes: No neck base, mediastinal or hilar masses or
enlarged lymph nodes. Trachea and esophagus are unremarkable.

Lungs/Pleura: Mild dependent hazy and reticular opacity in the
posterior lower lobes consistent with atelectasis. Lungs are
otherwise clear. No pleural effusion or pneumothorax.

Upper Abdomen: Visualized upper abdominal structures are within
normal limits.

Musculoskeletal: No chest wall abnormality. No acute or significant
osseous findings.

Review of the MIP images confirms the above findings.
IMPRESSION: 1. Essentially normal study. No evidence of a pulmonary embolism. No
acute findings.

## 2020-12-20 IMAGING — DX DG HAND COMPLETE 3+V*L*
1 series · 3 of 3 positions shown · non-contrast
Comparison: None.

CLINICAL DATA: Dog bite

EXAM:
LEFT HAND - COMPLETE 3+ VIEW; RIGHT HAND - COMPLETE 3+ VIEW

[Series 1: hand · 0.14mm/px · 3 of 3 slices shown]
[im 1/3]
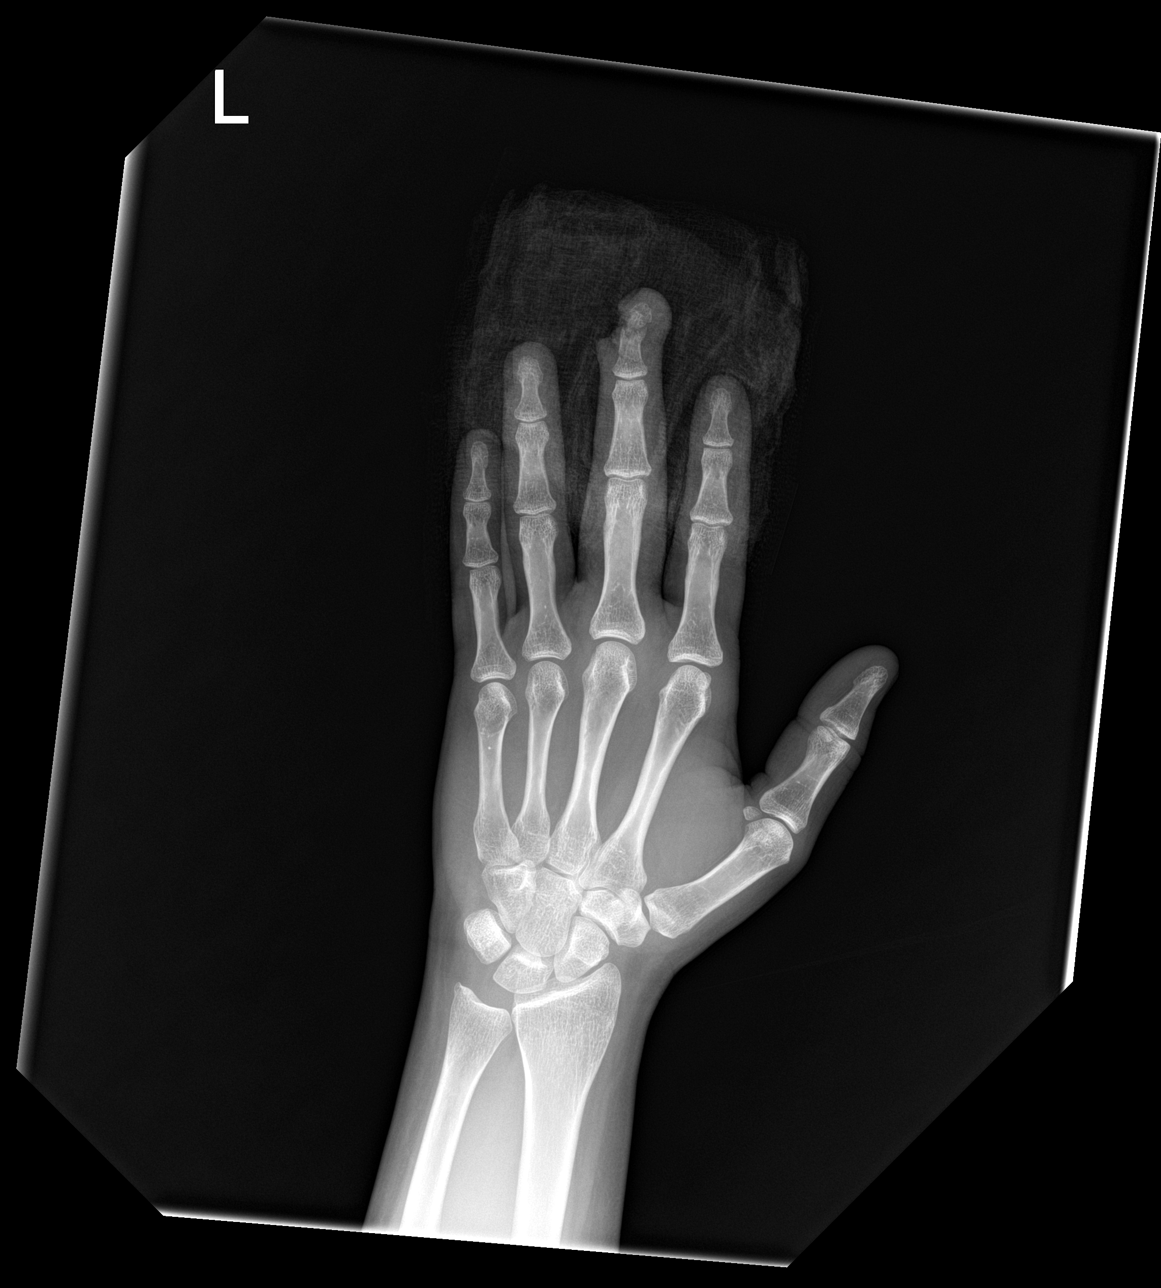
[im 2/3]
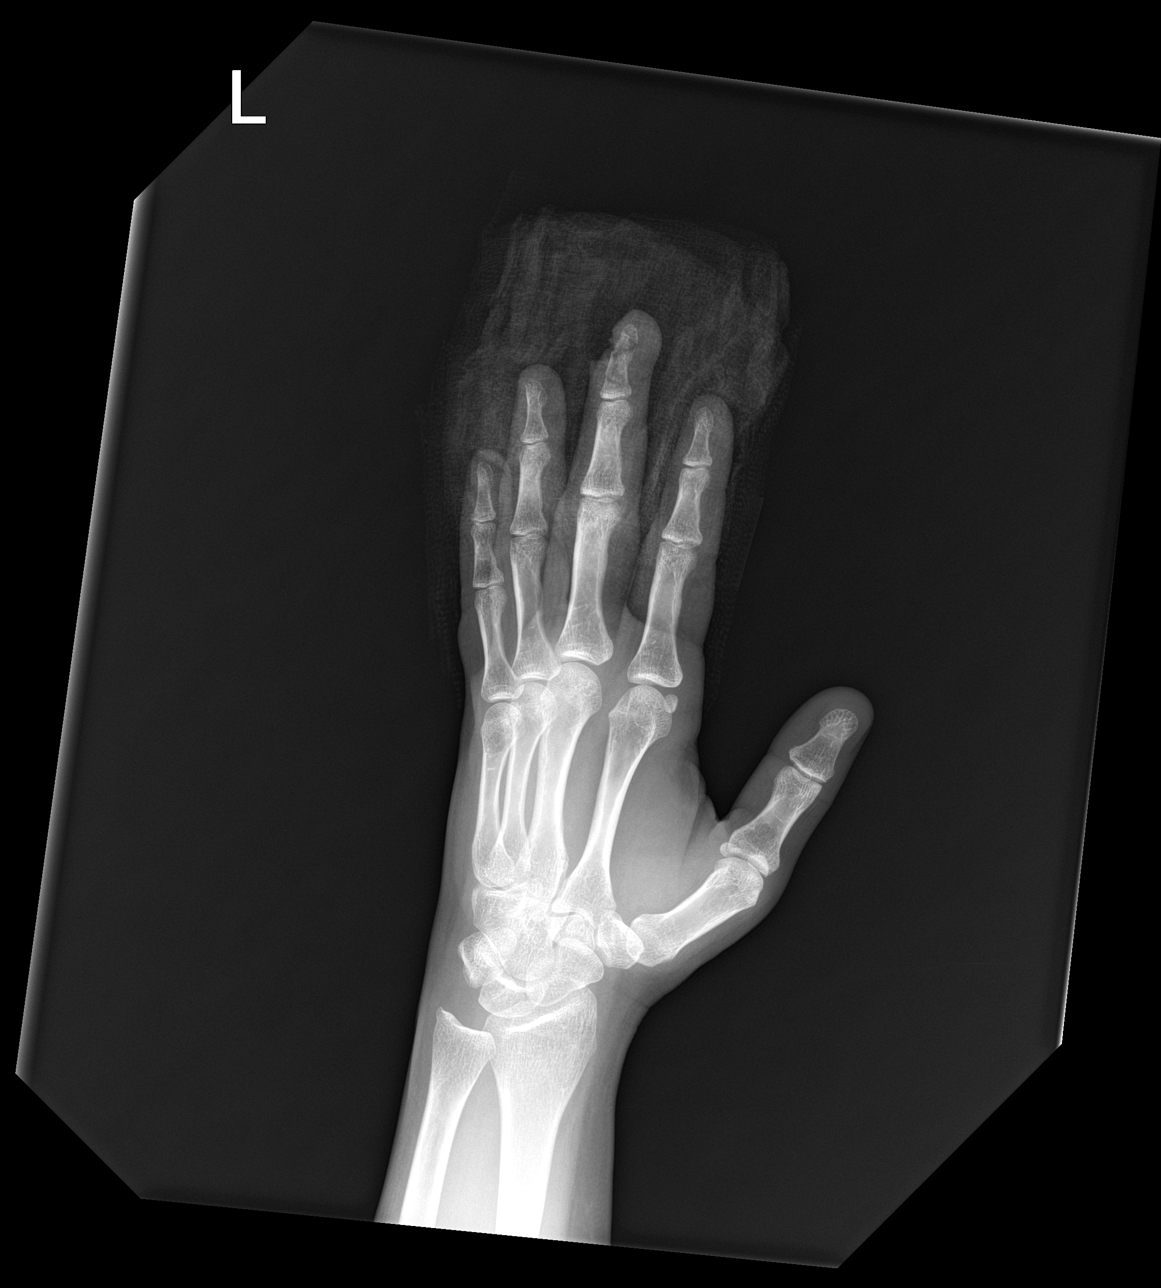
[im 3/3]
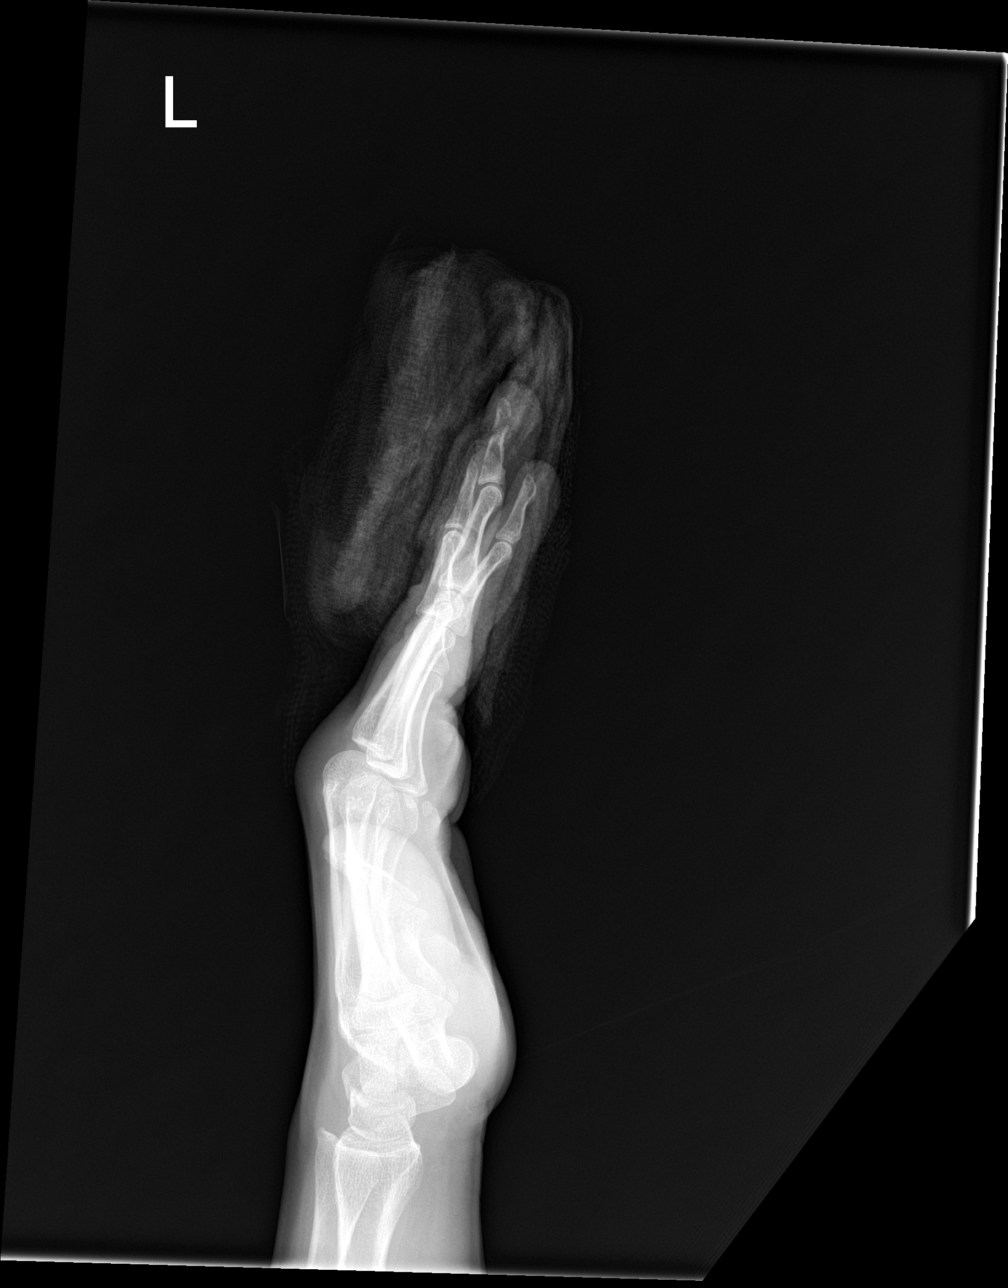

[3 of 3 positions shown; findings below may reference images not displayed]

FINDINGS: Fractures of the tuft of the left third distal phalanx with an
overlying soft tissue laceration. No radiopaque foreign body. No
other fracture or dislocation of the bilateral hands. Joint spaces
are preserved.
IMPRESSION: 1. Fractures of the tuft of the left third distal phalanx with an
overlying soft tissue laceration. No radiopaque foreign body.

2.  No other fracture or dislocation of the bilateral hands.

## 2021-06-11 IMAGING — CT CT ABD-PELV W/ CM
2 of 4 series · 16 of 46 positions shown, 18 images · IV contrast (omnipaque)
Comparison: CT abdomen pelvis 07/07/2016, ultrasound renal
11/09/2016

CLINICAL DATA: Nausea vomiting, epigastric pain.

EXAM:
CT ABDOMEN AND PELVIS WITH CONTRAST
TECHNIQUE: Multidetector CT imaging of the abdomen and pelvis was performed
using the standard protocol following bolus administration of
intravenous contrast.
CONTRAST:  100mL OMNIPAQUE IOHEXOL 300 MG/ML  SOLN

[Series 3: a/p w/ 5mm · axial · 0.94mm/px · z∈[+636,+1106]mm · 13 of 104 slices shown, 15 images]
[im 5/104  soft-tissue]
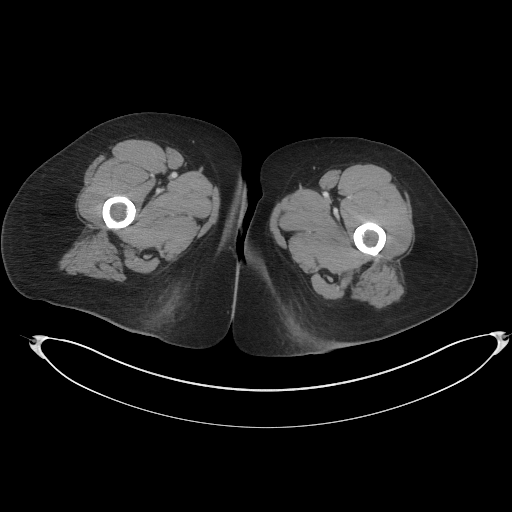
[im 5/104  bone]
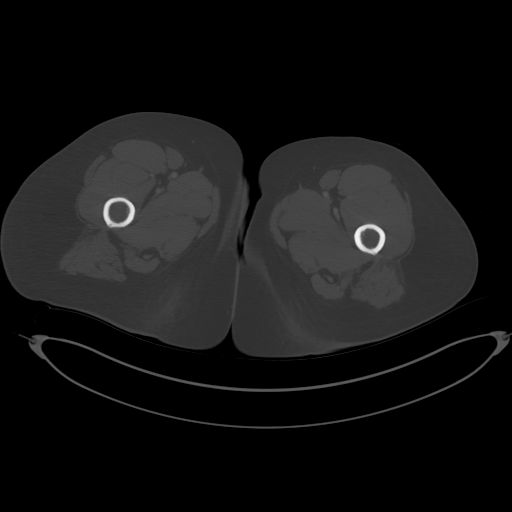
[im 13/104  soft-tissue]
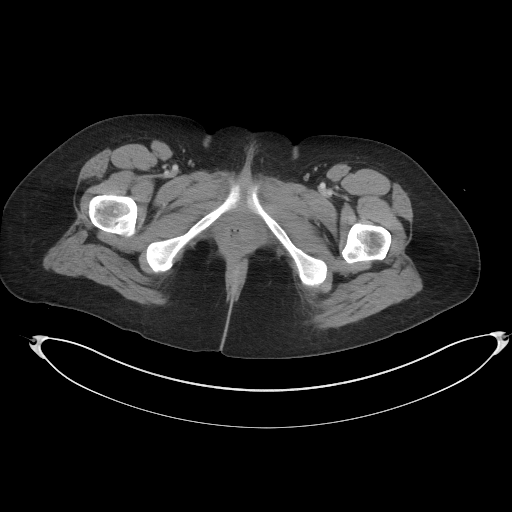
[im 22/104  soft-tissue]
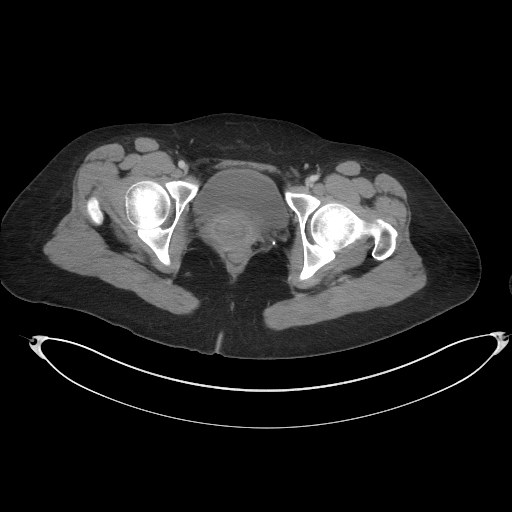
[im 31/104  soft-tissue]
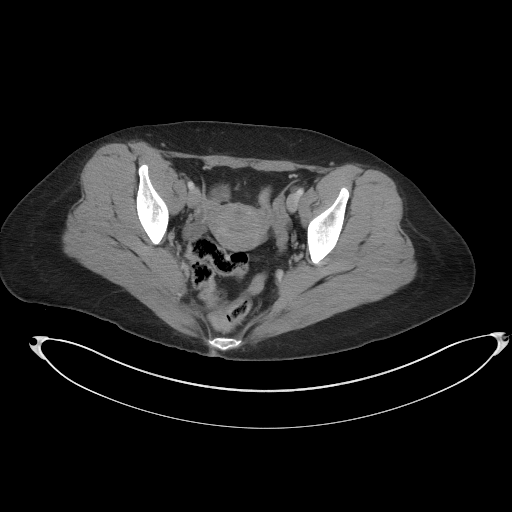
[im 35/104  soft-tissue]
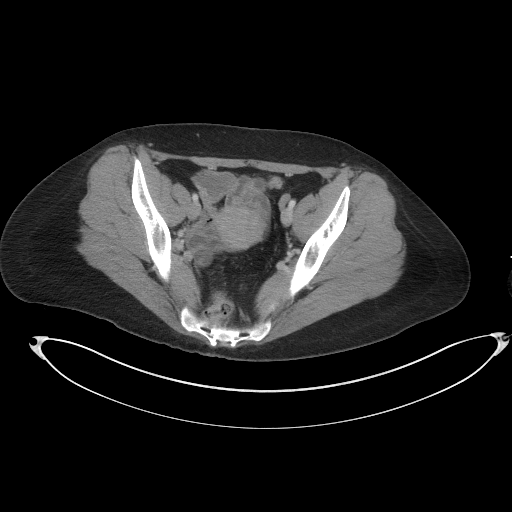
[im 43/104  soft-tissue]
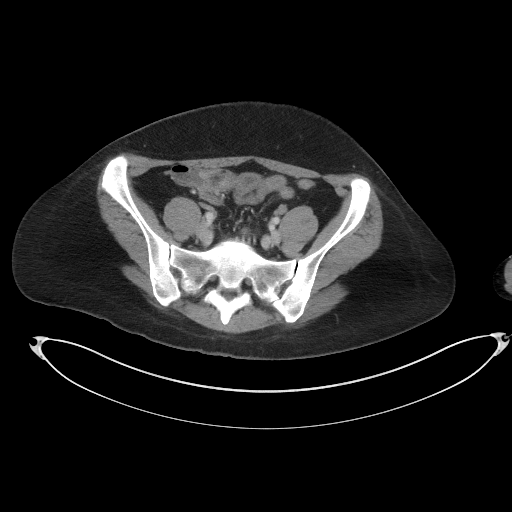
[im 52/104  soft-tissue]
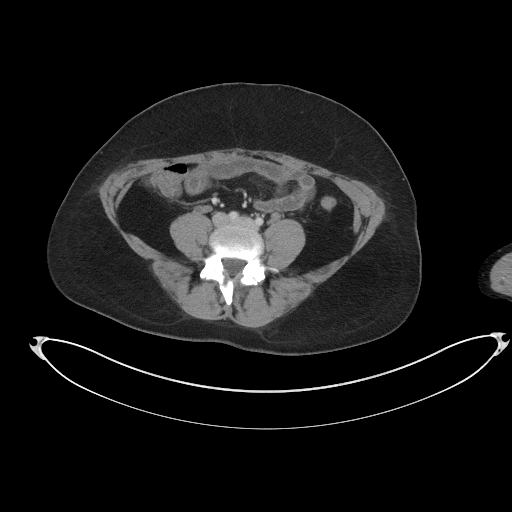
[im 61/104  soft-tissue]
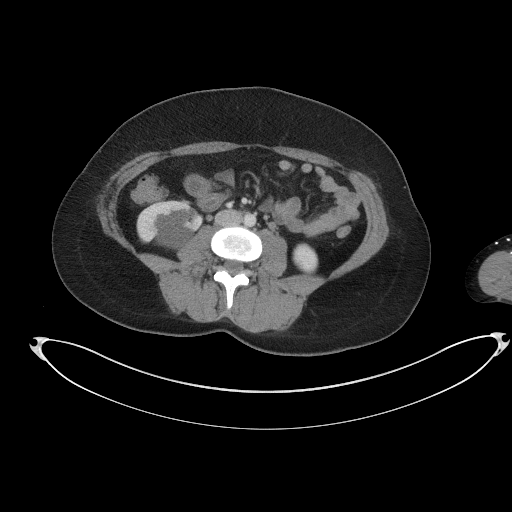
[im 69/104  soft-tissue]
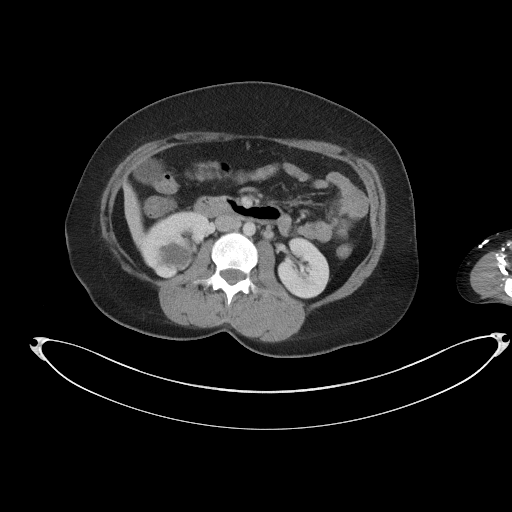
[im 69/104  bone]
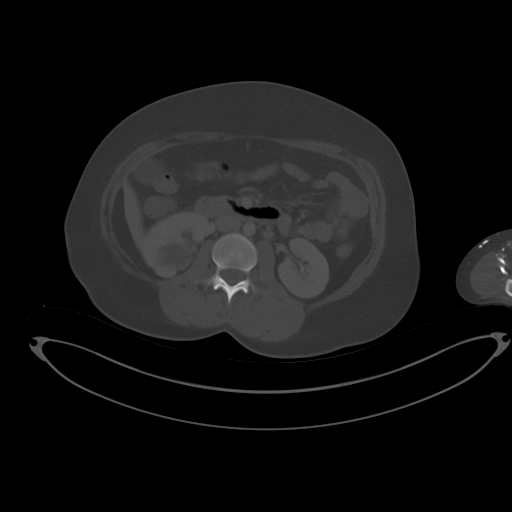
[im 73/104  soft-tissue]
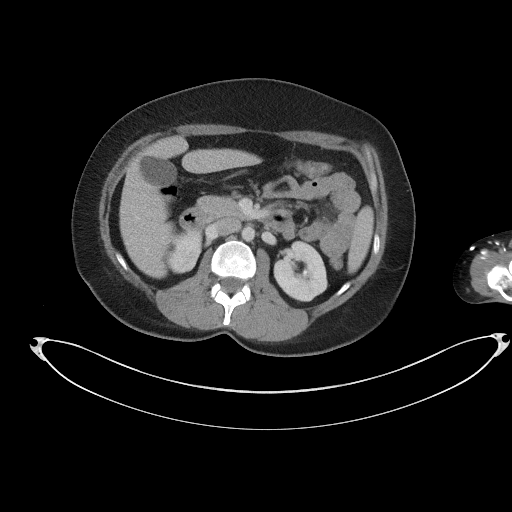
[im 82/104  soft-tissue]
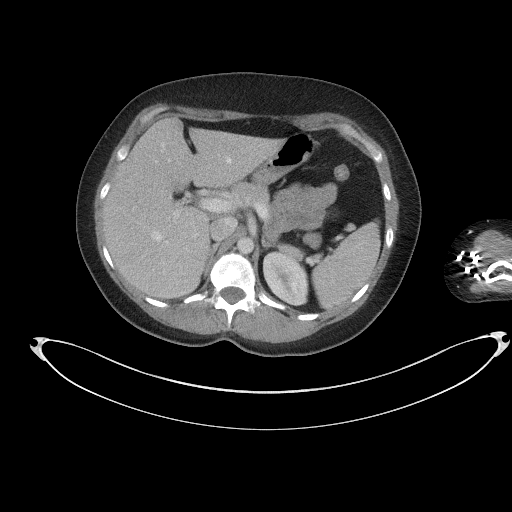
[im 91/104  soft-tissue]
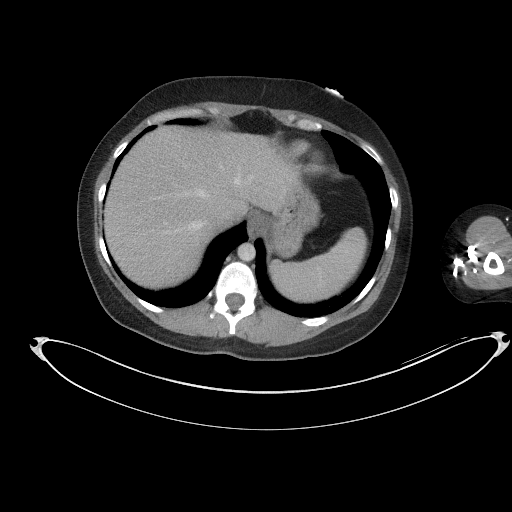
[im 99/104  soft-tissue]
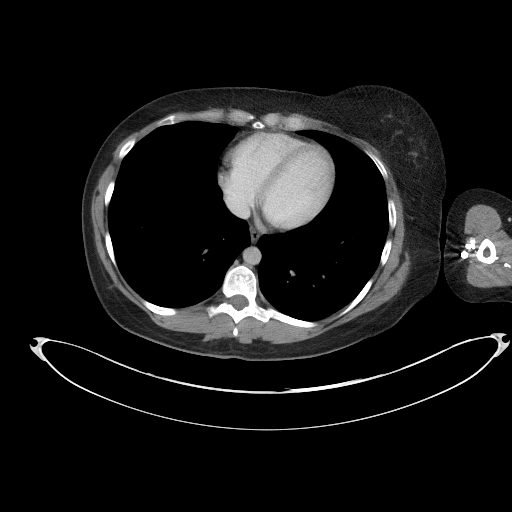

[Series 6: a/p w/ cor · coronal · 0.88mm/px · 3 of 162 slices shown]
[im 54/162  soft-tissue]
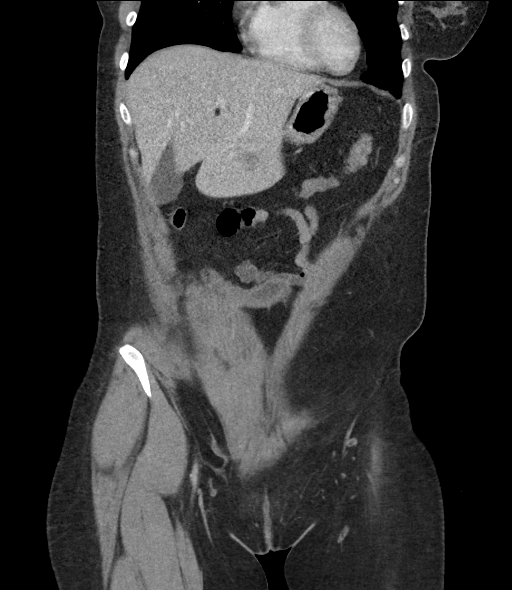
[im 72/162  soft-tissue]
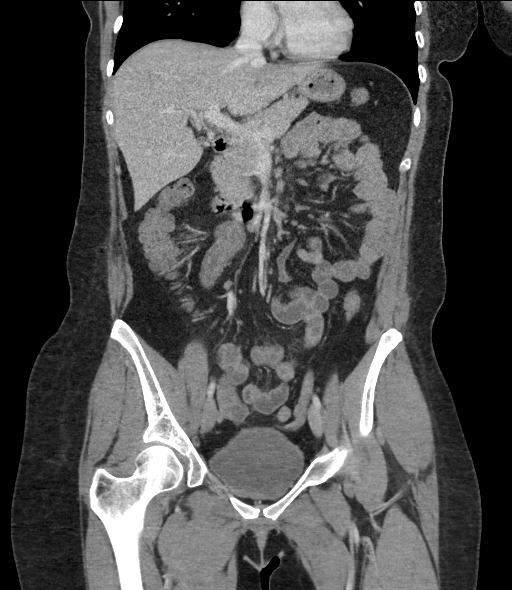
[im 90/162  soft-tissue]
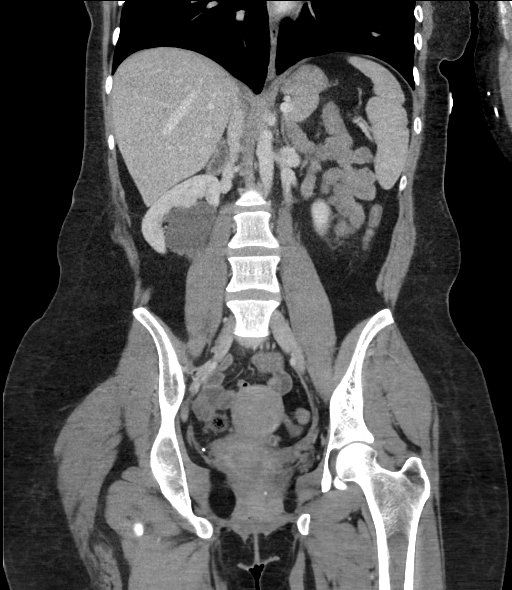

[16 of 46 positions shown; findings below may reference images not displayed]

FINDINGS: Lower chest: Bilateral lower lobe subsegmental atelectasis.

Hepatobiliary: No focal liver abnormality. No gallstones,
gallbladder wall thickening, or pericholecystic fluid. No biliary
dilatation.

Pancreas: No focal lesion. Normal pancreatic contour. No surrounding
inflammatory changes. No main pancreatic ductal dilatation.

Spleen: No focal liver abnormality. No gallstones, gallbladder wall
thickening, or pericholecystic fluid. No biliary dilatation.

Adrenals/Urinary Tract: No adrenal nodule bilaterally. Bilateral
kidneys enhance symmetrically. Persistent chronic severe right
hydroureteronephrosis. No left hydronephrosis. No left hydroureter.
The urinary bladder is unremarkable.

Stomach/Bowel: Stomach is within normal limits. Mild bowel wall
thickening and submucosal enhancement of a couple loops of mid
abdominal small bowel ([DATE]). Trace associated fat stranding. No
small bowel dilatation. No pneumatosis. No evidence of large bowel
wall thickening or dilatation. Appendix appears normal.

Vascular/Lymphatic: No significant vascular findings are present. No
enlarged abdominal or pelvic lymph nodes.

Reproductive: Uterus and bilateral adnexa are unremarkable.

Other: No intraperitoneal free fluid. No intraperitoneal free gas.
No organized fluid collection.

Musculoskeletal: No acute or significant osseous findings.
IMPRESSION: 1. Mild bowel wall thickening of a couple loops of mid abdominal
small bowel. Findings could represent enteritis. Differential
diagnosis for etiology includes infection, inflammation, ischemia.
No associated bowel obstruction or perforation.
2. Normal appendix.
3. Chronic severe right hydroureteronephrosis.

## 2022-06-27 ENCOUNTER — Ambulatory Visit (INDEPENDENT_AMBULATORY_CARE_PROVIDER_SITE_OTHER): Payer: 59 | Admitting: Obstetrics and Gynecology

## 2022-06-27 ENCOUNTER — Other Ambulatory Visit: Payer: Self-pay

## 2022-06-27 VITALS — BP 113/72 | HR 80 | Wt 162.4 lb

## 2022-06-27 DIAGNOSIS — Z3687 Encounter for antenatal screening for uncertain dates: Secondary | ICD-10-CM

## 2022-06-27 DIAGNOSIS — Z3201 Encounter for pregnancy test, result positive: Secondary | ICD-10-CM | POA: Diagnosis not present

## 2022-06-27 DIAGNOSIS — R112 Nausea with vomiting, unspecified: Secondary | ICD-10-CM

## 2022-06-27 DIAGNOSIS — O09521 Supervision of elderly multigravida, first trimester: Secondary | ICD-10-CM

## 2022-06-27 DIAGNOSIS — Z8759 Personal history of other complications of pregnancy, childbirth and the puerperium: Secondary | ICD-10-CM | POA: Insufficient documentation

## 2022-06-27 LAB — POCT PREGNANCY, URINE: Preg Test, Ur: POSITIVE — AB

## 2022-06-27 MED ORDER — PROMETHAZINE HCL 25 MG PO TABS: 25.0000 mg | ORAL_TABLET | Freq: Four times a day (QID) | ORAL | 1 refills | Status: DC | PRN

## 2022-06-27 NOTE — Patient Instructions (Addendum)
[facility] Prenatal Care Providers          [facility] locations:  Hours may vary. Please call for an appointment  [facility] at [facility]             [address], [place], [place] [address] [phone]  [facility] at [facility]                                                             [address], [address], [place], [place], [address] [phone]  [facility] at [place]                                    [address], [address], [place], [place], [address] [phone]  [facility] at [place] [address], [address], [place], [place], [address] [phone]  [facility] at [place]                                 [address], [place], [place], [address] [phone]  [facility] at [facility]                                    [address], [place], [place], [address] [phone]  [facility] at [facility], [address], [place], [place], [address]                                                   Safe Medications in Pregnancy    Acne: Benzoyl Peroxide Salicylic Acid  Backache/Headache: Tylenol: 2 regular strength every 4 hours OR              2 Extra strength every 6 hours  Colds/Coughs/Allergies: Benadryl (alcohol free) 25 mg every 6 hours as needed Breath right strips Claritin Cepacol throat lozenges Chloraseptic throat spray Cold-Eeze- up to three times per day Cough drops, alcohol free Flonase (by prescription only) Guaifenesin Mucinex Robitussin DM (plain only, alcohol free) Saline nasal spray/drops Sudafed (pseudoephedrine) & Actifed ** use only after [redacted] weeks gestation and if you do not have high blood pressure Tylenol Vicks Vaporub Zinc lozenges Zyrtec   Constipation: Colace Ducolax suppositories Fleet enema Glycerin  suppositories Metamucil Milk of magnesia Miralax Senokot Smooth move tea  Diarrhea: Kaopectate Imodium A-D  *NO pepto Bismol  Hemorrhoids: Anusol Anusol HC Preparation H Tucks  Indigestion: Tums Maalox Mylanta Zantac  Pepcid  Insomnia: Benadryl (alcohol free) 25mg every 6 hours as needed Tylenol PM Unisom, no Gelcaps  Leg Cramps: Tums MagGel  Nausea/Vomiting:  Bonine Dramamine Emetrol Ginger extract Sea bands Meclizine  Nausea medication to take during pregnancy:  Unisom (doxylamine succinate 25 mg tablets) Take one tablet daily at bedtime. If symptoms are not adequately controlled, the dose can be increased to a maximum recommended dose of two   tablets daily (1/2 tablet in the morning, 1/2 tablet mid-afternoon and one at bedtime). Vitamin B6 100mg tablets. Take one tablet twice a day (up to 200 mg per day).  Skin Rashes: Aveeno products Benadryl cream or 25mg every 6 hours as needed Calamine Lotion 1% cortisone cream  Yeast infection: Gyne-lotrimin 7 Monistat 7   **If taking multiple medications, please check labels to avoid duplicating the same active ingredients **take medication as directed on the label ** Do not exceed 4000 mg of tylenol in 24 hours **Do not take medications that contain aspirin or ibuprofen    

## 2022-06-27 NOTE — Progress Notes (Signed)
Patient was assessed and managed by nursing staff during this encounter. I have reviewed the chart and agree with the documentation and plan. I have also made any necessary editorial changes.  Longford Bing, MD 06/27/2022 10:53 PM

## 2022-06-27 NOTE — Progress Notes (Signed)
Patient is here for a pregnancy test. Urine pregnancy test positive. Per patient she had a positive pregnancy test at home on 06/13/22. Patient denies any abdominal pain but states she has been experiencing some light cramping. Ectopic and bleeding precautions reviewed. Patient denies any vaginal bleeding. Patient is unsure of LMP. Dating ultrasound scheduled. Patient does not have an OBGYN picked out yet for this pregnancy. List provided and I instructed patient to choose from list and set up new OB appointment after dating ultrasound. I recommended patient begin taking prenatal vitamins. Safe medication list provided to patient. Patient verbalized understanding. Patient states she has been experiencing nausea and vomiting. Prescription sent to patient's pharmacy.   Alesia Richards, RN 06/27/22

## 2022-07-05 ENCOUNTER — Other Ambulatory Visit: Payer: Self-pay | Admitting: Obstetrics and Gynecology

## 2022-07-05 ENCOUNTER — Ambulatory Visit (INDEPENDENT_AMBULATORY_CARE_PROVIDER_SITE_OTHER): Payer: 59

## 2022-07-05 DIAGNOSIS — O3680X0 Pregnancy with inconclusive fetal viability, not applicable or unspecified: Secondary | ICD-10-CM | POA: Diagnosis not present

## 2022-07-05 DIAGNOSIS — Z3687 Encounter for antenatal screening for uncertain dates: Secondary | ICD-10-CM

## 2022-07-27 ENCOUNTER — Encounter: Payer: Self-pay | Admitting: Family Medicine

## 2022-07-27 ENCOUNTER — Ambulatory Visit (INDEPENDENT_AMBULATORY_CARE_PROVIDER_SITE_OTHER): Payer: Medicaid Other | Admitting: Family Medicine

## 2022-07-27 ENCOUNTER — Other Ambulatory Visit (HOSPITAL_COMMUNITY)
Admission: RE | Admit: 2022-07-27 | Discharge: 2022-07-27 | Disposition: A | Discharge status: Home / Self Care | Payer: 59 | Source: Ambulatory Visit | Attending: Family Medicine | Admitting: Family Medicine

## 2022-07-27 VITALS — BP 108/71 | HR 81 | Wt 160.0 lb

## 2022-07-27 DIAGNOSIS — Z124 Encounter for screening for malignant neoplasm of cervix: Secondary | ICD-10-CM

## 2022-07-27 DIAGNOSIS — O09521 Supervision of elderly multigravida, first trimester: Secondary | ICD-10-CM | POA: Diagnosis not present

## 2022-07-27 DIAGNOSIS — Z3491 Encounter for supervision of normal pregnancy, unspecified, first trimester: Secondary | ICD-10-CM | POA: Diagnosis not present

## 2022-07-27 DIAGNOSIS — Z3490 Encounter for supervision of normal pregnancy, unspecified, unspecified trimester: Secondary | ICD-10-CM

## 2022-07-27 DIAGNOSIS — Z3A11 11 weeks gestation of pregnancy: Secondary | ICD-10-CM

## 2022-07-27 NOTE — Progress Notes (Signed)
Subjective:   Debbie Jackson is a 36 y.o. [id number] at [redacted]w[redacted]d by early ultrasound being seen today for her first obstetrical visit.  Her obstetrical history is significant for advanced maternal age. Patient does intend to breast feed. Pregnancy history fully reviewed.  Patient reports nausea.  HISTORY: OB History  Gravida Para Term Preterm AB Living  7 3 3  0 3 3  SAB IAB Ectopic Multiple Live Births  2 0 1 0 3    # Outcome Date GA Lbr Len/2nd Weight Sex Delivery Anes PTL Lv  7 Current           6 Term 06/11/15 [redacted]w[redacted]d 03:51 / 00:22 7 lb 12.3 oz (3.525 kg) F Vag-Spont None  LIV     Apgar1: 9  Apgar5: 9  5 SAB 2014          4 Term 12/31/10    F Vag-Spont   LIV  3 SAB 2010          2 Term 07/09/04    M Vag-Spont   LIV  1 Ectopic            Past Medical History:  Diagnosis Date   GERD (gastroesophageal reflux disease)    Migraine    Renal disorder    since childhood    Past Surgical History:  Procedure Laterality Date   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   Procedure: Irrigation and debridement, complex repair of left long finger with primary closure, open reduction and percutaneous pinning, and nail bed repair, left long finger.;  Surgeon: 07/03/2020, MD;  Location: [facility];  Service: Orthopedics;  Laterality: Left;   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   Procedure: Left long finger revision amputation;  Surgeon: 10/06/2020, MD;  Location: [facility];  Service: Orthopedics;  Laterality: Left;   ECTOPIC PREGNANCY SURGERY  yrs ago   right tube removed after burst tubal pregnancy   KIDNEY SURGERY     multiple kidney surgeries, born with double kidney   left finger necrosis     Family History  Problem Relation Age of Onset   Hypertension Mother    Hypertension Father    Cancer Maternal Grandfather    Cancer Paternal Grandfather    Alcohol abuse Neg Hx    Arthritis Neg Hx    Asthma Neg Hx    Birth defects Neg Hx    COPD Neg Hx    Depression Neg Hx     Diabetes Neg Hx    Drug abuse Neg Hx    Early death Neg Hx    Hearing loss Neg Hx    Heart disease Neg Hx    Hyperlipidemia Neg Hx    Kidney disease Neg Hx    Learning disabilities Neg Hx    Mental illness Neg Hx    Mental retardation Neg Hx    Miscarriages / Stillbirths Neg Hx    Stroke Neg Hx    Vision loss Neg Hx    Varicose Veins Neg Hx    Social History   Tobacco Use   Smoking status: Never   Smokeless tobacco: Never  Vaping Use   Vaping Use: Never used  Substance Use Topics   Alcohol use: No   Drug use: No   No Known Allergies Current Outpatient Medications on File Prior to Visit  Medication Sig Dispense Refill   Prenatal Vit-Fe Fumarate-FA (PRENATAL VITAMINS PO) Take 1 tablet by mouth daily.     No current facility-administered  medications on file prior to visit.     Exam   Vitals:   07/27/22 1551  BP: 108/71  Pulse: 81  Weight: 160 lb (72.6 kg)   Fetal Heart Rate (bpm): 162  Uterus:   12 week size  Pelvic Exam: Perineum: no hemorrhoids, normal perineum   Vulva: normal external genitalia, no lesions   Vagina:  normal mucosa, normal discharge   Cervix: no lesions and normal, pap smear done.    Adnexa: normal adnexa and no mass, fullness, tenderness   Bony Pelvis: average  System: General: well-developed, well-nourished female in no acute distress   Breast:  normal appearance, no masses or tenderness   Skin: normal coloration and turgor, no rashes   Neurologic: oriented, normal, negative, normal mood   Extremities: normal strength, tone, and muscle mass, ROM of all joints is normal   HEENT PERRLA, extraocular movement intact and sclera clear, anicteric   Mouth/Teeth mucous membranes moist, pharynx normal without lesions and dental hygiene good   Neck supple and no masses   Cardiovascular: regular rate and rhythm   Respiratory:  no respiratory distress, normal breath sounds   Abdomen: soft, non-tender; bowel sounds normal; no masses,  no organomegaly      Assessment:   Pregnancy: [id number] Patient Active Problem List   Diagnosis Date Noted   Supervision of normal pregnancy 07/27/2022   History of ectopic pregnancy 06/27/2022   AMA (advanced maternal age) multigravida 35+, first trimester 06/27/2022   Open fracture of tuft of distal phalanx of finger 05/03/2020   Vitamin D deficiency 01/21/2019   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 Encounter for supervision of normal pregnancy, antepartum, unspecified gravidity New OB labs - CBC/D/Plt+RPR+Rh+ABO+RubIgG... - Culture, OB Urine - [facility] [employer] - Enroll Patient in PreNatal Babyscripts  2. Screening for cervical cancer - Cytology - PAP  3. AMA (advanced maternal age) multigravida 35+, first trimester - PANORAMA PRENATAL TEST FULL PANEL   Initial labs drawn. Continue prenatal vitamins. Genetic Screening discussed, NIPS: ordered. Ultrasound discussed; fetal anatomic survey: ordered. Problem list reviewed and updated. The nature of [facility] with multiple MDs and other Advanced Practice Providers was explained to patient; also emphasized that residents, students are part of our team. Routine obstetric precautions reviewed. Return in 4 weeks (on 08/24/2022).

## 2022-07-27 NOTE — Progress Notes (Signed)
NOB  U/S on 07/05/22 Last pap: 12/2018 WNL at [facility] in care everywhere pt wants pap today. Hx of abnormal paps  Flu vaccine Declined. Genetic Screening: Desires  CC: Nausea

## 2022-07-28 LAB — CBC/D/PLT+RPR+RH+ABO+RUBIGG...
Antibody Screen: NEGATIVE
Basophils Absolute: 0.1 10*3/uL (ref 0.0–0.2)
Basos: 1 %
EOS (ABSOLUTE): 0.1 10*3/uL (ref 0.0–0.4)
Eos: 1 %
HCV Ab: NONREACTIVE
HIV Screen 4th Generation wRfx: NONREACTIVE
Hematocrit: 38.6 % (ref 34.0–46.6)
Hemoglobin: 13.2 g/dL (ref 11.1–15.9)
Hepatitis B Surface Ag: NEGATIVE
Immature Grans (Abs): 0 10*3/uL (ref 0.0–0.1)
Immature Granulocytes: 0 %
Lymphocytes Absolute: 2.4 10*3/uL (ref 0.7–3.1)
Lymphs: 24 %
MCH: 30.7 pg (ref 26.6–33.0)
MCHC: 34.2 g/dL (ref 31.5–35.7)
MCV: 90 fL (ref 79–97)
Monocytes Absolute: 0.6 10*3/uL (ref 0.1–0.9)
Monocytes: 6 %
Neutrophils Absolute: 6.8 10*3/uL (ref 1.4–7.0)
Neutrophils: 68 %
Platelets: 219 10*3/uL (ref 150–450)
RBC: 4.3 x10E6/uL (ref 3.77–5.28)
RDW: 12.9 % (ref 11.7–15.4)
RPR Ser Ql: NONREACTIVE
Rh Factor: POSITIVE
Rubella Antibodies, IGG: 2.5 index (ref >0.99)
WBC: 10 10*3/uL (ref 3.4–10.8)

## 2022-07-28 LAB — HCV INTERPRETATION

## 2022-07-31 LAB — URINE CULTURE, OB REFLEX

## 2022-07-31 LAB — CULTURE, OB URINE

## 2022-08-02 ENCOUNTER — Encounter: Payer: Self-pay | Admitting: Family Medicine

## 2022-08-03 LAB — PANORAMA PRENATAL TEST FULL PANEL:PANORAMA TEST PLUS 5 ADDITIONAL MICRODELETIONS: FETAL FRACTION: 6.4

## 2022-08-03 LAB — CYTOLOGY - PAP
Adequacy: ABSENT
Comment: NEGATIVE
Diagnosis: NEGATIVE
High risk HPV: NEGATIVE

## 2022-08-08 LAB — HORIZON CUSTOM: REPORT SUMMARY: NEGATIVE

## 2022-08-09 ENCOUNTER — Encounter: Payer: Self-pay | Admitting: Family Medicine

## 2022-08-25 ENCOUNTER — Ambulatory Visit (INDEPENDENT_AMBULATORY_CARE_PROVIDER_SITE_OTHER): Payer: Medicaid Other | Admitting: Obstetrics & Gynecology

## 2022-08-25 ENCOUNTER — Encounter: Payer: Self-pay | Admitting: Obstetrics & Gynecology

## 2022-08-25 VITALS — BP 108/71 | HR 80 | Wt 163.2 lb

## 2022-08-25 DIAGNOSIS — O09521 Supervision of elderly multigravida, first trimester: Secondary | ICD-10-CM

## 2022-08-25 DIAGNOSIS — O09522 Supervision of elderly multigravida, second trimester: Secondary | ICD-10-CM

## 2022-08-25 DIAGNOSIS — Q6210 Congenital occlusion of ureter, unspecified: Secondary | ICD-10-CM

## 2022-08-25 DIAGNOSIS — Z3482 Encounter for supervision of other normal pregnancy, second trimester: Secondary | ICD-10-CM

## 2022-08-25 DIAGNOSIS — Z3A15 15 weeks gestation of pregnancy: Secondary | ICD-10-CM

## 2022-08-25 NOTE — Progress Notes (Signed)
   PRENATAL VISIT NOTE  Subjective:  Debbie Jackson is a 36 y.o. [phone] at [redacted]w[redacted]d being seen today for ongoing prenatal care.  She is currently monitored for the following issues for this high-risk pregnancy and has Group B streptococcal bacteriuria; Open fracture of tuft of distal phalanx of finger; History of ectopic pregnancy; AMA (advanced maternal age) multigravida 45+, first trimester; Supervision of normal pregnancy; Congenital ureteral obstruction; Double ureter; Eustachian tube dysfunction, bilateral; Hydronephrosis with ureteropelvic junction (UPJ) obstruction; and Vitamin D deficiency on their problem list.  Patient reports one episode of dizziness and lower abdominal pain that occurred a week ago. No other episodes since, No CP, heart palpitations or SOB.  Contractions: Not present. Vag. Bleeding: None.  Movement: Present. Denies leaking of fluid.   The following portions of the patient's history were reviewed and updated as appropriate: allergies, current medications, past family history, past medical history, past social history, past surgical history and problem list.   Objective:   Vitals:   08/25/22 1547  BP: 108/71  Pulse: 80  Weight: 163 lb 3.2 oz (74 kg)    Fetal Status: Fetal Heart Rate (bpm): 147   Movement: Present     General:  Alert, oriented and cooperative. Patient is in no acute distress.  Skin: Skin is warm and dry. No rash noted.   Cardiovascular: Normal heart rate noted  Respiratory: Normal respiratory effort, no problems with respiration noted  Abdomen: Soft, gravid, appropriate for gestational age.  Pain/Pressure: Present     Pelvic: Cervical exam deferred        Extremities: Normal range of motion.  Edema: None  Mental Status: Normal mood and affect. Normal behavior. Normal judgment and thought content.   Assessment and Plan:  Pregnancy: [id number] at [redacted]w[redacted]d 1. AMA (advanced maternal age) multigravida 37+, first trimester AFP only today, had LR NIPS.   Anatomy scan to be scheduled. - AFP, Serum, Open Spina Bifida - [place] MFM OB DETAIL +14 WK; Future  2. Congenital ureteral obstruction Will schedule detailed anatomy scan - [place] MFM OB DETAIL +14 WK; Future  3. [redacted] weeks gestation of pregnancy 4. Encounter for supervision of other normal pregnancy in second trimester - AFP, Serum, Open Spina Bifida - [place] MFM OB DETAIL +14 WK; Future No other complaints or concerns.  Routine obstetric precautions reviewed. Please refer to After Visit Summary for other counseling recommendations.   Return in about 4 weeks (around 09/22/2022) for OFFICE OB VISIT (MD or APP).  Future Appointments  Date Time Provider [place]  09/21/2022  3:30 PM Donnamae Jude, MD [facility]  10/19/2022  4:10 PM Donnamae Jude, MD [facility]    Verita Schneiders, MD

## 2022-08-27 LAB — AFP, SERUM, OPEN SPINA BIFIDA
AFP MoM: 0.96
AFP Value: 30.5 ng/mL
Gest. Age on Collection Date: 15.9 weeks
Maternal Age At EDD: 36 yr
OSBR Risk 1 IN: 10000
Test Results:: NEGATIVE
Weight: 163 [lb_av]

## 2022-09-16 ENCOUNTER — Ambulatory Visit: Payer: Medicaid Other | Admitting: *Deleted

## 2022-09-16 ENCOUNTER — Encounter: Payer: Self-pay | Admitting: *Deleted

## 2022-09-16 ENCOUNTER — Ambulatory Visit: Payer: Medicaid Other | Attending: Obstetrics & Gynecology

## 2022-09-16 ENCOUNTER — Other Ambulatory Visit: Payer: Self-pay | Admitting: *Deleted

## 2022-09-16 VITALS — BP 117/64 | HR 72

## 2022-09-16 DIAGNOSIS — O321XX0 Maternal care for breech presentation, not applicable or unspecified: Secondary | ICD-10-CM | POA: Insufficient documentation

## 2022-09-16 DIAGNOSIS — O09522 Supervision of elderly multigravida, second trimester: Secondary | ICD-10-CM | POA: Diagnosis not present

## 2022-09-16 DIAGNOSIS — Q6210 Congenital occlusion of ureter, unspecified: Secondary | ICD-10-CM | POA: Insufficient documentation

## 2022-09-16 DIAGNOSIS — Z3A19 19 weeks gestation of pregnancy: Secondary | ICD-10-CM | POA: Insufficient documentation

## 2022-09-16 DIAGNOSIS — Z369 Encounter for antenatal screening, unspecified: Secondary | ICD-10-CM | POA: Insufficient documentation

## 2022-09-16 DIAGNOSIS — Z362 Encounter for other antenatal screening follow-up: Secondary | ICD-10-CM

## 2022-09-16 DIAGNOSIS — Z3689 Encounter for other specified antenatal screening: Secondary | ICD-10-CM | POA: Insufficient documentation

## 2022-09-16 DIAGNOSIS — O09521 Supervision of elderly multigravida, first trimester: Secondary | ICD-10-CM

## 2022-09-16 DIAGNOSIS — Z3482 Encounter for supervision of other normal pregnancy, second trimester: Secondary | ICD-10-CM

## 2022-09-21 ENCOUNTER — Ambulatory Visit (INDEPENDENT_AMBULATORY_CARE_PROVIDER_SITE_OTHER): Payer: Medicaid Other | Admitting: Family Medicine

## 2022-09-21 ENCOUNTER — Encounter: Payer: Self-pay | Admitting: Family Medicine

## 2022-09-21 VITALS — BP 100/68 | HR 88 | Wt 160.0 lb

## 2022-09-21 DIAGNOSIS — O09521 Supervision of elderly multigravida, first trimester: Secondary | ICD-10-CM

## 2022-09-21 DIAGNOSIS — Z3A19 19 weeks gestation of pregnancy: Secondary | ICD-10-CM

## 2022-09-21 DIAGNOSIS — O09522 Supervision of elderly multigravida, second trimester: Secondary | ICD-10-CM

## 2022-09-21 DIAGNOSIS — Z3482 Encounter for supervision of other normal pregnancy, second trimester: Secondary | ICD-10-CM

## 2022-09-21 DIAGNOSIS — R8271 Bacteriuria: Secondary | ICD-10-CM

## 2022-09-21 DIAGNOSIS — A084 Viral intestinal infection, unspecified: Secondary | ICD-10-CM

## 2022-09-21 DIAGNOSIS — Q6210 Congenital occlusion of ureter, unspecified: Secondary | ICD-10-CM

## 2022-09-21 LAB — POCT URINALYSIS DIPSTICK
Blood, UA: NEGATIVE
Spec Grav, UA: 1.02 (ref 1.010–1.025)

## 2022-09-21 NOTE — Progress Notes (Signed)
   PRENATAL VISIT NOTE  Subjective:  Debbie Jackson is a 36 y.o. [phone] at [redacted]w[redacted]d being seen today for ongoing prenatal care.  She is currently monitored for the following issues for this low-risk pregnancy and has Group B streptococcal bacteriuria; Open fracture of tuft of distal phalanx of finger; History of ectopic pregnancy; AMA (advanced maternal age) multigravida 35+, first trimester; Supervision of normal pregnancy; Congenital ureteral obstruction; Double ureter; Eustachian tube dysfunction, bilateral; Hydronephrosis with ureteropelvic junction (UPJ) obstruction; and Vitamin D deficiency on their problem list.  Patient reports nausea, vomiting, and diarrhea, c/w GI bug .  Contractions: Irritability. Vag. Bleeding: None.  Movement: Present. Denies leaking of fluid.   The following portions of the patient's history were reviewed and updated as appropriate: allergies, current medications, past family history, past medical history, past social history, past surgical history and problem list.   Objective:   Vitals:   09/21/22 1526  BP: 100/68  Pulse: 88  Weight: 160 lb (72.6 kg)    Fetal Status: Fetal Heart Rate (bpm): 147   Movement: Present     General:  Alert, oriented and cooperative. Patient is in no acute distress.  Skin: Skin is warm and dry. No rash noted.   Cardiovascular: Normal heart rate noted  Respiratory: Normal respiratory effort, no problems with respiration noted  Abdomen: Soft, gravid, appropriate for gestational age.  Pain/Pressure: Absent     Pelvic: Cervical exam deferred        Extremities: Normal range of motion.  Edema: None  Mental Status: Normal mood and affect. Normal behavior. Normal judgment and thought content.   Assessment and Plan:  Pregnancy: [id number] at [redacted]w[redacted]d 1. Encounter for supervision of other normal pregnancy in second trimester Continue routine prenatal care.  2. AMA (advanced maternal age) multigravida 64+, first trimester LR NIPT  3.  Group B streptococcal bacteriuria Will need treatment in labor  4. Viral gastroenteritis U/s shows dehydration Offered referral to MAU for IV fluids, declined--will push po fluids at home Check kidney function - Comprehensive metabolic panel  5. Congenital ureteral obstruction - Ambulatory referral to Urology  Preterm labor symptoms and general obstetric precautions including but not limited to vaginal bleeding, contractions, leaking of fluid and fetal movement were reviewed in detail with the patient. Please refer to After Visit Summary for other counseling recommendations.   Return in 4 weeks (on 10/19/2022).  Future Appointments  Date Time Provider [place]  10/19/2022  4:10 PM Donnamae Jude, MD [facility]  10/24/2022  8:30 AM [facility] NURSE [facility]  10/24/2022  8:45 AM [facility] US6 [facility]  11/23/2022  8:00 AM [facility] LAB [facility]  11/23/2022  8:55 AM Donnamae Jude, MD [facility]    Donnamae Jude, MD

## 2022-09-22 ENCOUNTER — Inpatient Hospital Stay (HOSPITAL_COMMUNITY)
Admission: AD | Admit: 2022-09-22 | Discharge: 2022-09-22 | Disposition: A | Discharge status: Home / Self Care | Payer: Medicaid Other | Attending: Obstetrics & Gynecology | Admitting: Obstetrics & Gynecology

## 2022-09-22 ENCOUNTER — Encounter (HOSPITAL_COMMUNITY): Payer: Self-pay | Admitting: Obstetrics & Gynecology

## 2022-09-22 ENCOUNTER — Other Ambulatory Visit: Payer: Self-pay

## 2022-09-22 DIAGNOSIS — O99282 Endocrine, nutritional and metabolic diseases complicating pregnancy, second trimester: Secondary | ICD-10-CM | POA: Insufficient documentation

## 2022-09-22 DIAGNOSIS — Z3A19 19 weeks gestation of pregnancy: Secondary | ICD-10-CM | POA: Insufficient documentation

## 2022-09-22 DIAGNOSIS — O99612 Diseases of the digestive system complicating pregnancy, second trimester: Secondary | ICD-10-CM | POA: Diagnosis not present

## 2022-09-22 DIAGNOSIS — E559 Vitamin D deficiency, unspecified: Secondary | ICD-10-CM | POA: Diagnosis not present

## 2022-09-22 DIAGNOSIS — E860 Dehydration: Secondary | ICD-10-CM | POA: Insufficient documentation

## 2022-09-22 DIAGNOSIS — O09292 Supervision of pregnancy with other poor reproductive or obstetric history, second trimester: Secondary | ICD-10-CM | POA: Diagnosis not present

## 2022-09-22 DIAGNOSIS — Q625 Duplication of ureter: Secondary | ICD-10-CM | POA: Diagnosis not present

## 2022-09-22 DIAGNOSIS — A084 Viral intestinal infection, unspecified: Secondary | ICD-10-CM | POA: Insufficient documentation

## 2022-09-22 DIAGNOSIS — O26892 Other specified pregnancy related conditions, second trimester: Secondary | ICD-10-CM | POA: Insufficient documentation

## 2022-09-22 DIAGNOSIS — R8271 Bacteriuria: Secondary | ICD-10-CM | POA: Insufficient documentation

## 2022-09-22 DIAGNOSIS — O09522 Supervision of elderly multigravida, second trimester: Secondary | ICD-10-CM | POA: Diagnosis not present

## 2022-09-22 DIAGNOSIS — O218 Other vomiting complicating pregnancy: Secondary | ICD-10-CM | POA: Diagnosis not present

## 2022-09-22 LAB — URINALYSIS, ROUTINE W REFLEX MICROSCOPIC
Bilirubin Urine: NEGATIVE
Glucose, UA: NEGATIVE mg/dL
Hgb urine dipstick: NEGATIVE
Ketones, ur: 80 mg/dL — AB
Nitrite: NEGATIVE
Protein, ur: NEGATIVE mg/dL
Specific Gravity, Urine: 1.023 (ref 1.005–1.030)
pH: 5 (ref 5.0–8.0)

## 2022-09-22 LAB — COMPREHENSIVE METABOLIC PANEL
ALT: 15 IU/L (ref 0–32)
AST: 18 IU/L (ref 0–40)
Albumin/Globulin Ratio: 1.8 (ref 1.2–2.2)
Albumin: 4.2 g/dL (ref 3.9–4.9)
Alkaline Phosphatase: 79 IU/L (ref 44–121)
BUN/Creatinine Ratio: 12 (ref 9–23)
BUN: 7 mg/dL (ref 6–20)
Bilirubin Total: 0.8 mg/dL (ref 0.0–1.2)
CO2: 22 mmol/L (ref 20–29)
Calcium: 9 mg/dL (ref 8.7–10.2)
Chloride: 101 mmol/L (ref 96–106)
Creatinine, Ser: 0.59 mg/dL (ref 0.57–1.00)
Globulin, Total: 2.4 g/dL (ref 1.5–4.5)
Glucose: 83 mg/dL (ref 70–99)
Potassium: 3.6 mmol/L (ref 3.5–5.2)
Sodium: 138 mmol/L (ref 134–144)
Total Protein: 6.6 g/dL (ref 6.0–8.5)
eGFR: 120 mL/min/{1.73_m2} (ref >59)

## 2022-09-22 MED ORDER — ONDANSETRON 8 MG PO TBDP: 8.0000 mg | ORAL_TABLET | Freq: Three times a day (TID) | ORAL | 2 refills | Status: DC | PRN

## 2022-09-22 MED ORDER — SCOPOLAMINE 1 MG/3DAYS TD PT72
1.0000 | MEDICATED_PATCH | TRANSDERMAL | Status: DC
  Administered 2022-09-22: 1.5 mg via TRANSDERMAL
  Filled 2022-09-22: qty 1

## 2022-09-22 MED ORDER — ONDANSETRON HCL 4 MG/2ML IJ SOLN
4.0000 mg | Freq: Once | INTRAMUSCULAR | Status: AC
  Administered 2022-09-22: 4 mg via INTRAVENOUS
  Filled 2022-09-22: qty 2

## 2022-09-22 MED ORDER — LACTATED RINGERS IV BOLUS
1000.0000 mL | Freq: Once | INTRAVENOUS | Status: AC
  Administered 2022-09-22: 1000 mL via INTRAVENOUS

## 2022-09-22 MED ORDER — SCOPOLAMINE 1 MG/3DAYS TD PT72: 1.0000 | MEDICATED_PATCH | TRANSDERMAL | 12 refills | Status: DC

## 2022-09-22 NOTE — Discharge Instructions (Signed)

## 2022-09-22 NOTE — MAU Provider Note (Addendum)
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 Started throwing up on Sunday -Tuesday. Also had diarrhea. Vomiting any fluids she tried to drink. Last time she drank without vomiting was yesterday around 7AM. Ate 3 white crackers. Has not vomited today. Has been improving somewhat, but also hasn't been eating. Also having "stomach cramps," (lower belly), that felt like contractions. Hasn't been able to take prenatal for past 2 days. Baby is moving, no VB, no LOF.  No problems like this in prior pregnancies. Goes to [facility] for prenatal care. Kids are 18, 11, 7. Eldest son  had Strep and COVID a week ago. No problems with pregnancy aside from very bad morning sickness, for which she took several anti-emetic prescription medications.  Has HA that doesn't go away with Tylenol. Sunday and Monday had a fever. Took some Tylenol for that, which helped. Some chills. Generalized weakness. Chronically has "floaters" in her vision.  No RUQ pain, CP, SOB. No vertigo or falls. No dysuria, although urine is dark. Stools are liquidy, but not black and not bloody. Denies hematuria.   Denies cough, sore throat, runny nose.     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   Procedure: Irrigation and debridement, complex repair of left long finger with primary closure, open reduction and percutaneous pinning, and nail bed repair, left long finger.;  Surgeon: Yolonda Kida, MD;  Location: [facility] OR;  Service: Orthopedics;  Laterality: Left;   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   Procedure: Left long finger revision amputation;  Surgeon: Yolonda Kida, MD;  Location: [facility];  Service: Orthopedics;  Laterality: Left;   ECTOPIC PREGNANCY SURGERY  yrs ago   right tube removed after burst tubal pregnancy   KIDNEY SURGERY     multiple kidney surgeries, born with double kidney   left finger necrosis      Family History  Problem Relation Age of Onset   Hypertension Mother    Hypertension Father    Cancer Maternal Grandfather    Cancer Paternal Grandfather    Alcohol abuse Neg Hx    Arthritis Neg Hx    Asthma Neg Hx    Birth defects Neg Hx    COPD Neg Hx    Depression Neg Hx    Diabetes Neg Hx    Drug abuse Neg Hx    Early death Neg Hx    Hearing loss Neg Hx    Heart disease Neg Hx    Hyperlipidemia Neg Hx  Kidney disease Neg Hx    Learning disabilities Neg Hx    Mental illness Neg Hx    Mental retardation Neg Hx    Miscarriages / Stillbirths Neg Hx    Stroke Neg Hx    Vision loss Neg Hx    Varicose Veins Neg Hx     Social History   Tobacco Use   Smoking status: Never   Smokeless tobacco: Never  Vaping Use   Vaping Use: Never used  Substance Use Topics   Alcohol use: No   Drug use: No    Allergies: No Known Allergies  Medications Prior to Admission  Medication Sig Dispense Refill Last Dose   Prenatal Vit-Fe Fumarate-FA (PRENATAL VITAMINS PO) Take 1 tablet by mouth daily.   Past Week    Review of Systems  Constitutional: Negative.  Negative for fatigue and fever.  HENT: Negative.    Respiratory: Negative.  Negative for shortness of breath.   Cardiovascular: Negative.  Negative for chest  pain.  Gastrointestinal:  Positive for nausea and vomiting. Negative for abdominal pain, constipation and diarrhea.  Genitourinary: Negative.  Negative for dysuria, vaginal bleeding and vaginal discharge.  Neurological: Negative.  Negative for dizziness and headaches.  All other systems reviewed and are negative.  Physical Exam   Blood pressure 118/62, pulse 81, temperature 98.4 F (36.9 C), temperature source Oral, resp. rate 16, height 5\' 6"  (1.676 m), weight 72.8 kg, SpO2 97 %.  Physical Exam Vitals and nursing note reviewed.  Constitutional:      General: She is not in acute distress.    Appearance: Normal appearance. She is normal weight. She is not ill-appearing, toxic-appearing or diaphoretic.  HENT:     Head: Normocephalic and atraumatic.  Eyes:     General: No scleral icterus.       Right eye: No discharge.        Left eye: No discharge.     Extraocular Movements: Extraocular movements intact.     Conjunctiva/sclera: Conjunctivae normal.     Pupils: Pupils are equal, round, and reactive to light.  Cardiovascular:     Rate and Rhythm: Normal rate and regular rhythm.     Heart sounds: Normal heart sounds. No murmur heard.    No friction rub. No gallop.     Comments: Weak and thready pulse Pulmonary:     Effort: Pulmonary effort is normal.     Breath sounds: Normal breath sounds and air entry.  Abdominal:     Comments: Gravid uterus to level of umbilicus. NT/ND, NABS, no HSM. No rigidity, no guarding  Musculoskeletal:        General: Normal range of motion.     Cervical back: Neck supple.  Skin:    General: Skin is warm and dry.     Capillary Refill: Capillary refill takes less than 2 seconds.  Neurological:     General: No focal deficit present.     Mental Status: She is alert.  Psychiatric:        Mood and Affect: Mood normal.        Behavior: Behavior normal.        Thought Content: Thought content normal.        Judgment: Judgment normal.     MAU Course   Procedures  MDM Patient has been having vomiting and diarrhea for several days now. While she has had nausea and vomiting that were significant in the first trimester, I do think these are unrelated. This seems to  be more viral in nature, especially as her symptoms have improved somewhat since onset. Her vital signs are normal, and physical exam was also unremarkable. Most likely dehydration due to poor PO intake in s/o vomiting/diarrhea due to viral illness.   After fluids and anti-emetics, she was feeling better and able to take PO. She may return home with Rx for anti-emetics.   Assessment and Plan  #Dehydration - Will bolus 1L LR over 1 hr - PO as tolerated  #Nausea and vomiting #Viral gastroenteritis - Symptomatic management  - Scopalamine patch  - Zofran  Linzie Collin 09/22/2022, 9:51 AM   Results for orders placed or performed during the hospital encounter of 09/22/22 (from the past 24 hour(s))  Urinalysis, Routine w reflex microscopic Urine, Clean Catch     Status: Abnormal   Collection Time: 09/22/22 10:05 AM  Result Value Ref Range   Color, Urine AMBER (A) YELLOW   APPearance CLOUDY (A) CLEAR   Specific Gravity, Urine 1.023 1.005 - 1.030   pH 5.0 5.0 - 8.0   Glucose, UA NEGATIVE NEGATIVE mg/dL   Hgb urine dipstick NEGATIVE NEGATIVE   Bilirubin Urine NEGATIVE NEGATIVE   Ketones, ur 80 (A) NEGATIVE mg/dL   Protein, ur NEGATIVE NEGATIVE mg/dL   Nitrite NEGATIVE NEGATIVE   Leukocytes,Ua SMALL (A) NEGATIVE   RBC / HPF 0-5 0 - 5 RBC/hpf   WBC, UA 6-10 0 - 5 WBC/hpf   Bacteria, UA RARE (A) NONE SEEN   Squamous Epithelial / LPF 21-50 0 - 5   Mucus PRESENT     Patient able to tolerate PO fluids and reports no more nausea.  1. Viral gastroenteritis   2. [redacted] weeks gestation of pregnancy    -Discharge home in stable condition -Rx for scop patches and zofran sent to pharmacy -Nausea/vomiting  precautions discussed -Patient advised to follow-up with OB as  scheduled for prenatal care -Patient may return to MAU as needed or if her condition were to change or worsen   Rolm Bookbinder, CNM 09/22/22 8:22 PM   Attestation of Supervision of Student:  I confirm that I have verified the information documented in the  resident  student's note and that I have also personally reperformed the history, physical exam and all medical decision making activities.  I have verified that all services and findings are accurately documented in this student's note; and I agree with management and plan as outlined in the documentation. I have also made any necessary editorial changes.  Rolm Bookbinder, CNM [facility] [employer] 09/22/2022 8:22 PM

## 2022-09-22 NOTE — MAU Note (Signed)
Debbie Jackson is a 37 y.o. at [redacted]w[redacted]d here in MAU reporting: started feeling unwell on Saturday. Since then has been having nausea, vomiting, and diarrhea. States none today. 2 episodes of emesis yesterday and 1 episode of diarrhea. States she get nauseated when she tries to eat or drink.   Onset of complaint: ongoing  Pain score: 0/10  Vitals:   09/22/22 0949  BP: 118/62  Pulse: 81  Resp: 16  Temp: 98.4 F (36.9 C)  SpO2: 97%     FHT:145  Lab orders placed from triage: UA

## 2022-09-23 LAB — URINE CULTURE, OB REFLEX

## 2022-09-23 LAB — CULTURE, OB URINE

## 2022-10-06 ENCOUNTER — Ambulatory Visit: Payer: Medicaid Other | Admitting: Urology

## 2022-10-06 ENCOUNTER — Encounter: Payer: Self-pay | Admitting: *Deleted

## 2022-10-19 ENCOUNTER — Ambulatory Visit (INDEPENDENT_AMBULATORY_CARE_PROVIDER_SITE_OTHER): Payer: Medicaid Other | Admitting: Family Medicine

## 2022-10-19 VITALS — BP 112/73 | HR 84 | Wt 171.0 lb

## 2022-10-19 DIAGNOSIS — Z3482 Encounter for supervision of other normal pregnancy, second trimester: Secondary | ICD-10-CM

## 2022-10-19 DIAGNOSIS — Z3A23 23 weeks gestation of pregnancy: Secondary | ICD-10-CM

## 2022-10-19 DIAGNOSIS — O09522 Supervision of elderly multigravida, second trimester: Secondary | ICD-10-CM

## 2022-10-19 DIAGNOSIS — R8271 Bacteriuria: Secondary | ICD-10-CM

## 2022-10-19 DIAGNOSIS — O09521 Supervision of elderly multigravida, first trimester: Secondary | ICD-10-CM

## 2022-10-19 NOTE — Progress Notes (Signed)
   PRENATAL VISIT NOTE  Subjective:  Debbie Jackson is a 36 y.o. [phone] at [redacted]w[redacted]d being seen today for ongoing prenatal care.  She is currently monitored for the following issues for this low-risk pregnancy and has Group B streptococcal bacteriuria; Open fracture of tuft of distal phalanx of finger; History of ectopic pregnancy; AMA (advanced maternal age) multigravida 35+, first trimester; Supervision of normal pregnancy; Congenital ureteral obstruction; Double ureter; Eustachian tube dysfunction, bilateral; Hydronephrosis with ureteropelvic junction (UPJ) obstruction; and Vitamin D deficiency on their problem list.  Patient reports no complaints.  Contractions: Not present. Vag. Bleeding: None.  Movement: Present. Denies leaking of fluid.   The following portions of the patient's history were reviewed and updated as appropriate: allergies, current medications, past family history, past medical history, past social history, past surgical history and problem list.   Objective:   Vitals:   10/19/22 1556  BP: 112/73  Pulse: 84  Weight: 171 lb (77.6 kg)    Fetal Status: Fetal Heart Rate (bpm): 142 Fundal Height: 23 cm Movement: Present     General:  Alert, oriented and cooperative. Patient is in no acute distress.  Skin: Skin is warm and dry. No rash noted.   Cardiovascular: Normal heart rate noted  Respiratory: Normal respiratory effort, no problems with respiration noted  Abdomen: Soft, gravid, appropriate for gestational age.  Pain/Pressure: Absent     Pelvic: Cervical exam deferred        Extremities: Normal range of motion.  Edema: None  Mental Status: Normal mood and affect. Normal behavior. Normal judgment and thought content.   Assessment and Plan:  Pregnancy: [id number] at [redacted]w[redacted]d 1. AMA (advanced maternal age) multigravida 35+, first trimester LR NIPT  2. Group B streptococcal bacteriuria Will need treatment in labor  3. Encounter for supervision of other normal pregnancy in  second trimester Continue routine prenatal care.   Preterm labor symptoms and general obstetric precautions including but not limited to vaginal bleeding, contractions, leaking of fluid and fetal movement were reviewed in detail with the patient. Please refer to After Visit Summary for other counseling recommendations.   Return in 4 weeks (on 11/16/2022) for 28 wk labs, [facility].  Future Appointments  Date Time Provider Department Center  10/24/2022  8:30 AM [facility] NURSE [facility]  10/24/2022  8:45 AM [facility] US6 [facility]  11/23/2022  8:00 AM [facility] LAB [facility]  11/23/2022  8:55 AM Reva Bores, MD [facility]  12/14/2022  4:10 PM Reva Bores, MD [facility]  12/21/2022  4:10 PM Reva Bores, MD [facility]    Reva Bores, MD

## 2022-10-24 ENCOUNTER — Ambulatory Visit: Payer: Medicaid Other | Admitting: *Deleted

## 2022-10-24 ENCOUNTER — Other Ambulatory Visit: Payer: Self-pay | Admitting: *Deleted

## 2022-10-24 ENCOUNTER — Ambulatory Visit: Payer: Medicaid Other | Attending: Obstetrics and Gynecology

## 2022-10-24 VITALS — BP 128/67 | HR 74

## 2022-10-24 DIAGNOSIS — O09522 Supervision of elderly multigravida, second trimester: Secondary | ICD-10-CM | POA: Diagnosis present

## 2022-10-24 DIAGNOSIS — O283 Abnormal ultrasonic finding on antenatal screening of mother: Secondary | ICD-10-CM

## 2022-10-24 DIAGNOSIS — Z87718 Personal history of other specified (corrected) congenital malformations of genitourinary system: Secondary | ICD-10-CM

## 2022-10-24 DIAGNOSIS — Z362 Encounter for other antenatal screening follow-up: Secondary | ICD-10-CM

## 2022-11-23 ENCOUNTER — Other Ambulatory Visit: Payer: Medicaid Other

## 2022-11-23 ENCOUNTER — Ambulatory Visit (INDEPENDENT_AMBULATORY_CARE_PROVIDER_SITE_OTHER): Payer: Medicaid Other | Admitting: Family Medicine

## 2022-11-23 VITALS — BP 118/73 | HR 86 | Wt 176.0 lb

## 2022-11-23 DIAGNOSIS — R8271 Bacteriuria: Secondary | ICD-10-CM

## 2022-11-23 DIAGNOSIS — Z3490 Encounter for supervision of normal pregnancy, unspecified, unspecified trimester: Secondary | ICD-10-CM

## 2022-11-23 DIAGNOSIS — Z3483 Encounter for supervision of other normal pregnancy, third trimester: Secondary | ICD-10-CM

## 2022-11-23 DIAGNOSIS — R3121 Asymptomatic microscopic hematuria: Secondary | ICD-10-CM

## 2022-11-23 DIAGNOSIS — O09523 Supervision of elderly multigravida, third trimester: Secondary | ICD-10-CM

## 2022-11-23 DIAGNOSIS — Z23 Encounter for immunization: Secondary | ICD-10-CM | POA: Diagnosis not present

## 2022-11-23 DIAGNOSIS — Z3A28 28 weeks gestation of pregnancy: Secondary | ICD-10-CM | POA: Diagnosis not present

## 2022-11-23 DIAGNOSIS — O09521 Supervision of elderly multigravida, first trimester: Secondary | ICD-10-CM

## 2022-11-23 DIAGNOSIS — Z3482 Encounter for supervision of other normal pregnancy, second trimester: Secondary | ICD-10-CM

## 2022-11-23 LAB — POCT URINALYSIS DIPSTICK
Spec Grav, UA: 1.01 (ref 1.010–1.025)
pH, UA: 8 — AB (ref 5.0–8.0)

## 2022-11-23 NOTE — Progress Notes (Signed)
ROB 29w5  CC: light pink spotting onset last night with wiping no recent intercourse.   Wants exam for assurance.   Notes Pain on left side that is constant all day by end of the day pain is 7/10x Has tried warm baths for comfort not much relief.  T-Dap : Received

## 2022-11-23 NOTE — Progress Notes (Signed)
   PRENATAL VISIT NOTE  Subjective:  Debbie Jackson is a 36 y.o. [phone] at [redacted]w[redacted]d being seen today for ongoing prenatal care.  She is currently monitored for the following issues for this low-risk pregnancy and has Group B streptococcal bacteriuria; Open fracture of tuft of distal phalanx of finger; History of ectopic pregnancy; AMA (advanced maternal age) multigravida 35+, first trimester; Supervision of normal pregnancy; Congenital ureteral obstruction; Double ureter; Eustachian tube dysfunction, bilateral; Hydronephrosis with ureteropelvic junction (UPJ) obstruction; and Vitamin D deficiency on their problem list.  Patient reports  saw spotting yesterday after wiping only, no staining in pants . Also notes 1 month or more of left sided pain, worse with standing, c/w RLP. Contractions: Not present. Vag. Bleeding: Other.  Movement: Present. Denies leaking of fluid.   The following portions of the patient's history were reviewed and updated as appropriate: allergies, current medications, past family history, past medical history, past social history, past surgical history and problem list.   Objective:   Vitals:   11/23/22 0907  BP: 118/73  Pulse: 86  Weight: 176 lb (79.8 kg)    Fetal Status:   Fundal Height: 29 cm Movement: Present     General:  Alert, oriented and cooperative. Patient is in no acute distress.  Skin: Skin is warm and dry. No rash noted.   Cardiovascular: Normal heart rate noted  Respiratory: Normal respiratory effort, no problems with respiration noted  Abdomen: Soft, gravid, appropriate for gestational age.  Pain/Pressure: Present     Pelvic: Cervical exam performed in the presence of a chaperone Cervix is visually closed, no evidence of blood in vault       Extremities: Normal range of motion.  Edema: None  Mental Status: Normal mood and affect. Normal behavior. Normal judgment and thought content.  Urinalysis Showed tr blood and tr LE  Assessment and Plan:   Pregnancy: [id number] at [redacted]w[redacted]d 1. Encounter for supervision of other normal pregnancy in second trimester 28 wk labs TDaP today RLP present Will check urine as possible source of spotting noted on yesterday.--check urine culture.  2. AMA (advanced maternal age) multigravida 35+, first trimester For f/u u/s  3. Group B streptococcal bacteriuria Will need treatment in labor  4. Need for Tdap vaccination   Preterm labor symptoms and general obstetric precautions including but not limited to vaginal bleeding, contractions, leaking of fluid and fetal movement were reviewed in detail with the patient. Please refer to After Visit Summary for other counseling recommendations.   Return in 2 weeks (on 12/07/2022).  Future Appointments  Date Time Provider Department Center  12/14/2022  4:10 PM Reva Bores, MD [facility]  12/19/2022  9:15 AM [facility] NURSE [facility]  12/19/2022  9:30 AM [facility] US3 [facility]  12/21/2022  4:10 PM Reva Bores, MD [facility]    Reva Bores, MD

## 2022-11-24 LAB — GLUCOSE TOLERANCE, 2 HOURS W/ 1HR
Glucose, 1 hour: 165 mg/dL (ref 70–179)
Glucose, 2 hour: 140 mg/dL (ref 70–152)
Glucose, Fasting: 83 mg/dL (ref 70–91)

## 2022-11-24 LAB — RPR: RPR Ser Ql: NONREACTIVE

## 2022-11-24 LAB — CBC
Hematocrit: 34.8 % (ref 34.0–46.6)
Hemoglobin: 11.6 g/dL (ref 11.1–15.9)
MCH: 31.5 pg (ref 26.6–33.0)
MCHC: 33.3 g/dL (ref 31.5–35.7)
MCV: 95 fL (ref 79–97)
Platelets: 165 10*3/uL (ref 150–450)
RBC: 3.68 x10E6/uL — ABNORMAL LOW (ref 3.77–5.28)
RDW: 12.3 % (ref 11.7–15.4)
WBC: 8.6 10*3/uL (ref 3.4–10.8)

## 2022-11-24 LAB — HIV ANTIBODY (ROUTINE TESTING W REFLEX): HIV Screen 4th Generation wRfx: NONREACTIVE

## 2022-11-25 ENCOUNTER — Encounter: Payer: Self-pay | Admitting: Family Medicine

## 2022-11-25 LAB — CULTURE, OB URINE

## 2022-11-25 LAB — URINE CULTURE, OB REFLEX: Organism ID, Bacteria: NO GROWTH

## 2022-11-28 NOTE — L&D Delivery Note (Signed)
Delivery Note  37 y.o. [id number] at 49w5dadmitted for IOL following SROM this morning. She received one round of cytotec and progressed to full cervical dilation.  At 7:18 PM a viable female was delivered via Vaginal, Spontaneous (Presentation:  ROA    ).  APGAR: 8, 9; weight  TBD Placenta status: Spontaneous, Intact.  Cord: 3 vessels with the following complications: None.  Cord pH: not collected.  Anesthesia: None Episiotomy: None Lacerations: None Suture Repair:  none Est. Blood Loss (mL): 106  Mom to postpartum.  Baby to Couplet care / Skin to Skin.  CLiliane ChannelMD MPH OB Fellow, FAm[place]itafor [place]2/28/2024

## 2022-12-01 ENCOUNTER — Encounter: Payer: Self-pay | Admitting: *Deleted

## 2022-12-14 ENCOUNTER — Ambulatory Visit (INDEPENDENT_AMBULATORY_CARE_PROVIDER_SITE_OTHER): Payer: Medicaid Other | Admitting: Family Medicine

## 2022-12-14 VITALS — BP 122/80 | HR 93 | Wt 179.0 lb

## 2022-12-14 DIAGNOSIS — O09521 Supervision of elderly multigravida, first trimester: Secondary | ICD-10-CM

## 2022-12-14 DIAGNOSIS — Z3482 Encounter for supervision of other normal pregnancy, second trimester: Secondary | ICD-10-CM

## 2022-12-14 DIAGNOSIS — O09523 Supervision of elderly multigravida, third trimester: Secondary | ICD-10-CM

## 2022-12-14 DIAGNOSIS — Z3A31 31 weeks gestation of pregnancy: Secondary | ICD-10-CM

## 2022-12-14 NOTE — Progress Notes (Signed)
   PRENATAL VISIT NOTE  Subjective:  Debbie Jackson is a 37 y.o. [phone] at [redacted]w[redacted]d being seen today for ongoing prenatal care.  She is currently monitored for the following issues for this high-risk pregnancy and has Group B streptococcal bacteriuria; Open fracture of tuft of distal phalanx of finger; History of ectopic pregnancy; AMA (advanced maternal age) multigravida 24+, first trimester; Supervision of normal pregnancy; Congenital ureteral obstruction; Double ureter; Eustachian tube dysfunction, bilateral; Hydronephrosis with ureteropelvic junction (UPJ) obstruction; and Vitamin D deficiency on their problem list.  Patient reports no complaints.  Contractions: Irritability. Vag. Bleeding: None.  Movement: Present. Denies leaking of fluid.   The following portions of the patient's history were reviewed and updated as appropriate: allergies, current medications, past family history, past medical history, past social history, past surgical history and problem list.   Objective:   Vitals:   12/14/22 1634  BP: 122/80  Pulse: 93  Weight: 179 lb (81.2 kg)    Fetal Status: Fetal Heart Rate (bpm): 147 Fundal Height: 31 cm Movement: Present     General:  Alert, oriented and cooperative. Patient is in no acute distress.  Skin: Skin is warm and dry. No rash noted.   Cardiovascular: Normal heart rate noted  Respiratory: Normal respiratory effort, no problems with respiration noted  Abdomen: Soft, gravid, appropriate for gestational age.  Pain/Pressure: Absent     Pelvic: Cervical exam deferred        Extremities: Normal range of motion.  Edema: None  Mental Status: Normal mood and affect. Normal behavior. Normal judgment and thought content.   Assessment and Plan:  Pregnancy: [id number] at [redacted]w[redacted]d 1. Encounter for supervision of other normal pregnancy in second trimester Continue routine prenatal care.  2. AMA (advanced maternal age) multigravida 54+, first trimester Doing well  3.  Sterilization counseling BTL papers signed today  Patient counseled, r.e. Risks benefits of BTL, including permanency of procedure, discussed benefits of salpingectomy including almost no failure and prevention of ovarian cancer.   Patient verbalized understanding and desires to proceed  Preterm labor symptoms and general obstetric precautions including but not limited to vaginal bleeding, contractions, leaking of fluid and fetal movement were reviewed in detail with the patient. Please refer to After Visit Summary for other counseling recommendations.   Return in 2 weeks (on 12/28/2022).  Future Appointments  Date Time Provider [place]  12/19/2022  9:15 AM [facility] NURSE [facility]  12/19/2022  9:30 AM [facility] US3 [facility]  12/21/2022  4:10 PM Donnamae Jude, MD [facility]    Donnamae Jude, MD

## 2022-12-19 ENCOUNTER — Ambulatory Visit: Payer: Medicaid Other | Admitting: *Deleted

## 2022-12-19 ENCOUNTER — Ambulatory Visit: Payer: Medicaid Other | Attending: Obstetrics

## 2022-12-19 VITALS — BP 134/75 | HR 87

## 2022-12-19 DIAGNOSIS — O09522 Supervision of elderly multigravida, second trimester: Secondary | ICD-10-CM | POA: Diagnosis not present

## 2022-12-19 DIAGNOSIS — O99891 Other specified diseases and conditions complicating pregnancy: Secondary | ICD-10-CM | POA: Diagnosis not present

## 2022-12-19 DIAGNOSIS — Z3A32 32 weeks gestation of pregnancy: Secondary | ICD-10-CM | POA: Diagnosis not present

## 2022-12-19 DIAGNOSIS — Q6210 Congenital occlusion of ureter, unspecified: Secondary | ICD-10-CM

## 2022-12-19 DIAGNOSIS — Z87718 Personal history of other specified (corrected) congenital malformations of genitourinary system: Secondary | ICD-10-CM | POA: Insufficient documentation

## 2022-12-19 DIAGNOSIS — O283 Abnormal ultrasonic finding on antenatal screening of mother: Secondary | ICD-10-CM | POA: Insufficient documentation

## 2022-12-19 DIAGNOSIS — O09523 Supervision of elderly multigravida, third trimester: Secondary | ICD-10-CM

## 2022-12-21 ENCOUNTER — Ambulatory Visit (INDEPENDENT_AMBULATORY_CARE_PROVIDER_SITE_OTHER): Payer: Medicaid Other | Admitting: Family Medicine

## 2022-12-21 ENCOUNTER — Encounter: Payer: Self-pay | Admitting: Family Medicine

## 2022-12-21 VITALS — BP 112/74 | HR 78 | Wt 178.0 lb

## 2022-12-21 DIAGNOSIS — Z3483 Encounter for supervision of other normal pregnancy, third trimester: Secondary | ICD-10-CM

## 2022-12-21 DIAGNOSIS — O09521 Supervision of elderly multigravida, first trimester: Secondary | ICD-10-CM

## 2022-12-21 DIAGNOSIS — R8271 Bacteriuria: Secondary | ICD-10-CM

## 2022-12-21 DIAGNOSIS — O09523 Supervision of elderly multigravida, third trimester: Secondary | ICD-10-CM

## 2022-12-21 DIAGNOSIS — Z3A32 32 weeks gestation of pregnancy: Secondary | ICD-10-CM

## 2022-12-21 NOTE — Progress Notes (Signed)
   PRENATAL VISIT NOTE  Subjective:  Debbie Jackson is a 37 y.o. [phone] at [redacted]w[redacted]d being seen today for ongoing prenatal care.  She is currently monitored for the following issues for this low-risk pregnancy and has Group B streptococcal bacteriuria; Open fracture of tuft of distal phalanx of finger; History of ectopic pregnancy; AMA (advanced maternal age) multigravida 42+, first trimester; Supervision of normal pregnancy; Congenital ureteral obstruction; Double ureter; Eustachian tube dysfunction, bilateral; Hydronephrosis with ureteropelvic junction (UPJ) obstruction; and Vitamin D deficiency on their problem list.  Patient reports no complaints.  Contractions: Irritability. Vag. Bleeding: None.  Movement: Present. Denies leaking of fluid.   The following portions of the patient's history were reviewed and updated as appropriate: allergies, current medications, past family history, past medical history, past social history, past surgical history and problem list.   Objective:   Vitals:   12/21/22 1626  BP: 112/74  Pulse: 78  Weight: 178 lb (80.7 kg)    Fetal Status: Fetal Heart Rate (bpm): 147 Fundal Height: 31 cm Movement: Present     General:  Alert, oriented and cooperative. Patient is in no acute distress.  Skin: Skin is warm and dry. No rash noted.   Cardiovascular: Normal heart rate noted  Respiratory: Normal respiratory effort, no problems with respiration noted  Abdomen: Soft, gravid, appropriate for gestational age.  Pain/Pressure: Present     Pelvic: Cervical exam deferred        Extremities: Normal range of motion.  Edema: None  Mental Status: Normal mood and affect. Normal behavior. Normal judgment and thought content.   Assessment and Plan:  Pregnancy: [id number] at [redacted]w[redacted]d 1. Encounter for supervision of other normal pregnancy in third trimester Having occasional ctx's. PTL precautions reviewed  2. Group B streptococcal bacteriuria Will need treatment in labor  3.  AMA (advanced maternal age) multigravida 38+, first trimester LR NIPT  Preterm labor symptoms and general obstetric precautions including but not limited to vaginal bleeding, contractions, leaking of fluid and fetal movement were reviewed in detail with the patient. Please refer to After Visit Summary for other counseling recommendations.   Return in 2 weeks (on 01/04/2023).  Future Appointments  Date Time Provider [place]  01/03/2023  2:30 PM Aletha Halim, MD [facility]  01/11/2023  2:30 PM Donnamae Jude, MD [facility]  01/18/2023  2:30 PM Donnamae Jude, MD [facility]  01/24/2023  1:30 PM Aletha Halim, MD [facility]  01/31/2023  1:30 PM Harolyn Rutherford, Sallyanne Havers, MD [facility]  02/07/2023  2:30 PM Aletha Halim, MD [facility]    Donnamae Jude, MD

## 2022-12-21 NOTE — Progress Notes (Signed)
ROB [redacted]w[redacted]d  CC: None

## 2022-12-26 ENCOUNTER — Inpatient Hospital Stay (HOSPITAL_COMMUNITY)
Admission: AD | Admit: 2022-12-26 | Discharge: 2022-12-26 | Disposition: A | Discharge status: Home / Self Care | Payer: Medicaid Other | Attending: Obstetrics & Gynecology | Admitting: Obstetrics & Gynecology

## 2022-12-26 ENCOUNTER — Encounter (HOSPITAL_COMMUNITY): Payer: Self-pay | Admitting: Obstetrics & Gynecology

## 2022-12-26 DIAGNOSIS — Z3A33 33 weeks gestation of pregnancy: Secondary | ICD-10-CM

## 2022-12-26 DIAGNOSIS — O479 False labor, unspecified: Secondary | ICD-10-CM | POA: Diagnosis not present

## 2022-12-26 DIAGNOSIS — Z3689 Encounter for other specified antenatal screening: Secondary | ICD-10-CM

## 2022-12-26 LAB — URINALYSIS, ROUTINE W REFLEX MICROSCOPIC
Bilirubin Urine: NEGATIVE
Glucose, UA: NEGATIVE mg/dL
Hgb urine dipstick: NEGATIVE
Ketones, ur: 5 mg/dL — AB
Nitrite: NEGATIVE
Protein, ur: 30 mg/dL — AB
Specific Gravity, Urine: 1.02 (ref 1.005–1.030)
pH: 6 (ref 5.0–8.0)

## 2022-12-26 MED ORDER — LACTATED RINGERS IV BOLUS
1000.0000 mL | Freq: Once | INTRAVENOUS | Status: AC
  Administered 2022-12-26: 1000 mL via INTRAVENOUS

## 2022-12-26 NOTE — MAU Note (Signed)
External monitors removed per provider.

## 2022-12-26 NOTE — MAU Provider Note (Cosign Needed)
Event Date/Time   First Provider Initiated Contact with Patient 12/26/22 2026     S: Ms. Debbie Jackson is a 37 y.o. [id number] at [redacted]w[redacted]d  who presents to MAU today complaining contractions q 5 minutes since around 1700. She denies vaginal bleeding. She endorses LOF. She reports normal fetal movement.    O: BP 117/71 (BP Location: Right Arm)   Pulse 79   Temp 98.8 F (37.1 C)   Resp 16   Ht 5\' 6"  (1.676 m)   Wt 180 lb 1.6 oz (81.7 kg)   SpO2 99%   BMI 29.07 kg/m  GENERAL: Well-developed, well-nourished female in no acute distress.  HEAD: Normocephalic, atraumatic.  CHEST: Normal effort of breathing, regular heart rate ABDOMEN: Soft, nontender, gravid  Cervical exam:  Dilation: 1 Effacement (%): 10 Cervical Position: Posterior Station: Ballotable Presentation: Undeterminable Exam by:: Gaylan Gerold, CNM  Fetal Monitoring: reactive Baseline: 125 Variability: moderate Accelerations: 15x15 Decelerations: none Contractions: initially 3-9min apart but became irregular about q52min  MAU Course: Cervix 1cm but thick and posterior. LR bolus ordered/given which eased contractions considerably. Since only 1cm, did not recheck patient, she was amenable to discharge home with labor precautions.  A: SIUP at [redacted]w[redacted]d  False labor NST reactive  P: Discharge home with preterm labor precautions  Follow up at [place] as scheduled for ongoing prenatal care  Gabriel Carina, CNM 12/26/2022 9:55 PM

## 2022-12-26 NOTE — MAU Note (Signed)
Liter bolus infused- pt reports she is still aware of Uc's but frequency is less

## 2022-12-26 NOTE — MAU Note (Signed)
.  Debbie Jackson is a 37 y.o. at [redacted]w[redacted]d here in MAU reporting: CTX since 1500 today, started irregularly cramping and increased to about 5 mins apart. Pt denies hx of preterm labor or del. Pt denies DFM, VB, LOF, abnormal discharge,  recent intercourse, PIH s/s, and complications in the pregnancy.   Onset of complaint: 1500 Pain score: 5/10 CTX Vitals:   12/26/22 1923  BP: 117/71  Pulse: 79  Resp: 16  Temp: 98.8 F (37.1 C)  SpO2: 99%     FHT:155 Lab orders placed from triage:  UA

## 2022-12-29 ENCOUNTER — Encounter: Payer: Self-pay | Admitting: Family Medicine

## 2023-01-03 ENCOUNTER — Ambulatory Visit (INDEPENDENT_AMBULATORY_CARE_PROVIDER_SITE_OTHER): Payer: Medicaid Other | Admitting: Obstetrics and Gynecology

## 2023-01-03 ENCOUNTER — Encounter: Payer: Self-pay | Admitting: Obstetrics and Gynecology

## 2023-01-03 VITALS — BP 110/75 | HR 94 | Wt 182.4 lb

## 2023-01-03 DIAGNOSIS — O09523 Supervision of elderly multigravida, third trimester: Secondary | ICD-10-CM

## 2023-01-03 DIAGNOSIS — R8271 Bacteriuria: Secondary | ICD-10-CM

## 2023-01-03 DIAGNOSIS — O09521 Supervision of elderly multigravida, first trimester: Secondary | ICD-10-CM

## 2023-01-03 DIAGNOSIS — Z3A34 34 weeks gestation of pregnancy: Secondary | ICD-10-CM

## 2023-01-03 NOTE — Progress Notes (Signed)
   PRENATAL VISIT NOTE  Subjective:  Debbie Jackson is a 37 y.o. [phone] at [redacted]w[redacted]d being seen today for ongoing prenatal care.  She is currently monitored for the following issues for this low-risk pregnancy and has Group B streptococcal bacteriuria; History of ectopic pregnancy; AMA (advanced maternal age) multigravida 40+, first trimester; Supervision of normal pregnancy; Congenital ureteral obstruction; Double ureter; Eustachian tube dysfunction, bilateral; Hydronephrosis with ureteropelvic junction (UPJ) obstruction; and Vitamin D deficiency on their problem list.  Patient reports no complaints.  Contractions: Not present. Vag. Bleeding: None.  Movement: Present. Denies leaking of fluid.   The following portions of the patient's history were reviewed and updated as appropriate: allergies, current medications, past family history, past medical history, past social history, past surgical history and problem list.   Objective:   Vitals:   01/03/23 1433  BP: 110/75  Pulse: 94  Weight: 182 lb 6.4 oz (82.7 kg)    Fetal Status: Fetal Heart Rate (bpm): 141   Movement: Present     General:  Alert, oriented and cooperative. Patient is in no acute distress.  Skin: Skin is warm and dry. No rash noted.   Cardiovascular: Normal heart rate noted  Respiratory: Normal respiratory effort, no problems with respiration noted  Abdomen: Soft, gravid, appropriate for gestational age.  Pain/Pressure: Present     Pelvic: Cervical exam deferred        Extremities: Normal range of motion.  Edema: None  Mental Status: Normal mood and affect. Normal behavior. Normal judgment and thought content.   Assessment and Plan:  Pregnancy: [id number] at [redacted]w[redacted]d 1. [redacted] weeks gestation of pregnancy PTL precautions given. Was 1/thick a week ago  2. AMA (advanced maternal age) multigravida 61+, first trimester 1/22 growth u/s wnl. Repeat prn.   3. Group B streptococcal bacteriuria Tx in labor  Preterm labor symptoms  and general obstetric precautions including but not limited to vaginal bleeding, contractions, leaking of fluid and fetal movement were reviewed in detail with the patient. Please refer to After Visit Summary for other counseling recommendations.   No follow-ups on file.  Future Appointments  Date Time Provider Department Center  01/11/2023  2:30 PM Donnamae Jude, MD [facility]  01/18/2023  2:30 PM Donnamae Jude, MD [facility]  01/24/2023  1:30 PM Aletha Halim, MD [facility]  01/31/2023  1:30 PM Harolyn Rutherford, Sallyanne Havers, MD [facility]  02/07/2023  2:30 PM Aletha Halim, MD [facility]    Aletha Halim, MD

## 2023-01-03 NOTE — Progress Notes (Signed)
CC:Routine OB   

## 2023-01-03 NOTE — Progress Notes (Signed)
CC:

## 2023-01-11 ENCOUNTER — Other Ambulatory Visit (HOSPITAL_COMMUNITY)
Admission: RE | Admit: 2023-01-11 | Discharge: 2023-01-11 | Disposition: A | Discharge status: Home / Self Care | Payer: Medicaid Other | Source: Ambulatory Visit | Attending: Family Medicine | Admitting: Family Medicine

## 2023-01-11 ENCOUNTER — Ambulatory Visit (INDEPENDENT_AMBULATORY_CARE_PROVIDER_SITE_OTHER): Payer: Medicaid Other | Admitting: Family Medicine

## 2023-01-11 VITALS — BP 116/78 | HR 94 | Wt 187.0 lb

## 2023-01-11 DIAGNOSIS — O09521 Supervision of elderly multigravida, first trimester: Secondary | ICD-10-CM

## 2023-01-11 DIAGNOSIS — Z3A35 35 weeks gestation of pregnancy: Secondary | ICD-10-CM

## 2023-01-11 DIAGNOSIS — Z3483 Encounter for supervision of other normal pregnancy, third trimester: Secondary | ICD-10-CM

## 2023-01-11 DIAGNOSIS — O09523 Supervision of elderly multigravida, third trimester: Secondary | ICD-10-CM

## 2023-01-11 DIAGNOSIS — R8271 Bacteriuria: Secondary | ICD-10-CM

## 2023-01-11 NOTE — Progress Notes (Signed)
CC: Routine ob  Does want cervix checked today.

## 2023-01-11 NOTE — Progress Notes (Signed)
   PRENATAL VISIT NOTE  Subjective:  Debbie Jackson is a 37 y.o. [phone] at [redacted]w[redacted]d being seen today for ongoing prenatal care.  She is currently monitored for the following issues for this low-risk pregnancy and has Group B streptococcal bacteriuria; History of ectopic pregnancy; AMA (advanced maternal age) multigravida 32+, first trimester; Supervision of normal pregnancy; Congenital ureteral obstruction; Double ureter; Eustachian tube dysfunction, bilateral; Hydronephrosis with ureteropelvic junction (UPJ) obstruction; and Vitamin D deficiency on their problem list.  Patient reports no complaints.  Contractions: Irregular. Vag. Bleeding: None.  Movement: Present. Denies leaking of fluid.   The following portions of the patient's history were reviewed and updated as appropriate: allergies, current medications, past family history, past medical history, past social history, past surgical history and problem list.   Objective:   Vitals:   01/11/23 1435  BP: 116/78  Pulse: 94  Weight: 187 lb (84.8 kg)    Fetal Status: Fetal Heart Rate (bpm): 148 Fundal Height: 34 cm Movement: Present  Presentation: Vertex  General:  Alert, oriented and cooperative. Patient is in no acute distress.  Skin: Skin is warm and dry. No rash noted.   Cardiovascular: Normal heart rate noted  Respiratory: Normal respiratory effort, no problems with respiration noted  Abdomen: Soft, gravid, appropriate for gestational age.  Pain/Pressure: Present     Pelvic: Cervical exam performed in the presence of a chaperone Dilation: 1 Effacement (%): 20 Station: -2, -3  Extremities: Normal range of motion.  Edema: None  Mental Status: Normal mood and affect. Normal behavior. Normal judgment and thought content.   Assessment and Plan:  Pregnancy: [id number] at [redacted]w[redacted]d 1. Encounter for supervision of other normal pregnancy in third trimester Cultures today - Cervicovaginal ancillary only  2. Group B streptococcal  bacteriuria Will need treatment in labor  3. AMA (advanced maternal age) multigravida 20+, first trimester Low NIPT  Preterm labor symptoms and general obstetric precautions including but not limited to vaginal bleeding, contractions, leaking of fluid and fetal movement were reviewed in detail with the patient. Please refer to After Visit Summary for other counseling recommendations.   Return in 1 week (on 01/18/2023).  Future Appointments  Date Time Provider Department Center  01/18/2023  2:30 PM Donnamae Jude, MD [facility]  01/24/2023  1:30 PM Aletha Halim, MD [facility]  01/31/2023  1:30 PM Harolyn Rutherford, Sallyanne Havers, MD [facility]  02/07/2023  2:30 PM Aletha Halim, MD [facility]    Donnamae Jude, MD

## 2023-01-16 LAB — CERVICOVAGINAL ANCILLARY ONLY
Chlamydia: NEGATIVE
Comment: NEGATIVE
Comment: NORMAL
Neisseria Gonorrhea: NEGATIVE

## 2023-01-18 ENCOUNTER — Ambulatory Visit (INDEPENDENT_AMBULATORY_CARE_PROVIDER_SITE_OTHER): Payer: Medicaid Other | Admitting: Family Medicine

## 2023-01-18 VITALS — BP 123/79 | HR 84 | Wt 187.0 lb

## 2023-01-18 DIAGNOSIS — R8271 Bacteriuria: Secondary | ICD-10-CM

## 2023-01-18 DIAGNOSIS — Z3A36 36 weeks gestation of pregnancy: Secondary | ICD-10-CM

## 2023-01-18 DIAGNOSIS — O09521 Supervision of elderly multigravida, first trimester: Secondary | ICD-10-CM

## 2023-01-18 DIAGNOSIS — O09523 Supervision of elderly multigravida, third trimester: Secondary | ICD-10-CM

## 2023-01-18 DIAGNOSIS — Z3483 Encounter for supervision of other normal pregnancy, third trimester: Secondary | ICD-10-CM

## 2023-01-18 NOTE — Progress Notes (Signed)
   PRENATAL VISIT NOTE  Subjective:  QUANTELLA QUAM is a 37 y.o. [phone] at 49w5dbeing seen today for ongoing prenatal care.  She is currently monitored for the following issues for this low-risk pregnancy and has Group B streptococcal bacteriuria; History of ectopic pregnancy; AMA (advanced maternal age) multigravida 370+ first trimester; Supervision of normal pregnancy; Congenital ureteral obstruction; Double ureter; Eustachian tube dysfunction, bilateral; Hydronephrosis with ureteropelvic junction (UPJ) obstruction; and Vitamin D deficiency on their problem list.  Patient reports no complaints.  Contractions: Irregular. Vag. Bleeding: None.  Movement: Present. Denies leaking of fluid.   The following portions of the patient's history were reviewed and updated as appropriate: allergies, current medications, past family history, past medical history, past social history, past surgical history and problem list.   Objective:   Vitals:   01/18/23 1416  BP: 123/79  Pulse: 84  Weight: 187 lb (84.8 kg)    Fetal Status: Fetal Heart Rate (bpm): 139 Fundal Height: 35 cm Movement: Present  Presentation: Vertex  General:  Alert, oriented and cooperative. Patient is in no acute distress.  Skin: Skin is warm and dry. No rash noted.   Cardiovascular: Normal heart rate noted  Respiratory: Normal respiratory effort, no problems with respiration noted  Abdomen: Soft, gravid, appropriate for gestational age.  Pain/Pressure: Absent     Pelvic: Cervical exam deferred        Extremities: Normal range of motion.  Edema: None  Mental Status: Normal mood and affect. Normal behavior. Normal judgment and thought content.   Assessment and Plan:  Pregnancy: [id number] 320w5d. Encounter for supervision of other normal pregnancy in third trimester Continue routine prenatal care.  2. Group B streptococcal bacteriuria Will need treatment in labor  3. AMA (advanced maternal age) multigravida 3536+first  trimester LR NIPT  Preterm labor symptoms and general obstetric precautions including but not limited to vaginal bleeding, contractions, leaking of fluid and fetal movement were reviewed in detail with the patient. Please refer to After Visit Summary for other counseling recommendations.   Return in 1 week (on 01/25/2023).  Future Appointments  Date Time Provider [place]2/27/2024  1:30 PM PiAletha HalimMD [facility]  01/31/2023  1:30 PM Anyanwu, UgSallyanne HaversMD [facility]  02/07/2023  2:30 PM PiAletha HalimMD [facility]    TaDonnamae JudeMD

## 2023-01-18 NOTE — Progress Notes (Signed)
CC: Contractions randomly lasting about 1 hour and then stopping

## 2023-01-23 ENCOUNTER — Ambulatory Visit (INDEPENDENT_AMBULATORY_CARE_PROVIDER_SITE_OTHER): Payer: Medicaid Other | Admitting: Obstetrics and Gynecology

## 2023-01-23 ENCOUNTER — Encounter: Payer: Self-pay | Admitting: Obstetrics and Gynecology

## 2023-01-23 VITALS — BP 106/72 | HR 81 | Wt 187.0 lb

## 2023-01-23 DIAGNOSIS — O09523 Supervision of elderly multigravida, third trimester: Secondary | ICD-10-CM

## 2023-01-23 DIAGNOSIS — R8271 Bacteriuria: Secondary | ICD-10-CM

## 2023-01-23 DIAGNOSIS — O09521 Supervision of elderly multigravida, first trimester: Secondary | ICD-10-CM

## 2023-01-23 DIAGNOSIS — Z3A37 37 weeks gestation of pregnancy: Secondary | ICD-10-CM

## 2023-01-23 NOTE — Progress Notes (Signed)
CC: Last night contractions pretty regular, lower cramping and taking her breath away

## 2023-01-23 NOTE — Progress Notes (Signed)
   PRENATAL VISIT NOTE  Subjective:  Debbie Jackson is a 37 y.o. [phone] at 47w3dbeing seen today for ongoing prenatal care.  She is currently monitored for the following issues for this low-risk pregnancy and has Group B streptococcal bacteriuria; History of ectopic pregnancy; AMA (advanced maternal age) multigravida 331+ first trimester; Supervision of normal pregnancy; Congenital ureteral obstruction; Double ureter; Eustachian tube dysfunction, bilateral; Hydronephrosis with ureteropelvic junction (UPJ) obstruction; and Vitamin D deficiency on their problem list.  Patient reports no complaints.  Contractions: Not present. Vag. Bleeding: None.  Movement: Present. Denies leaking of fluid.   The following portions of the patient's history were reviewed and updated as appropriate: allergies, current medications, past family history, past medical history, past social history, past surgical history and problem list.   Objective:   Vitals:   01/23/23 1432  BP: 106/72  Pulse: 81  Weight: 187 lb (84.8 kg)    Fetal Status: Fetal Heart Rate (bpm): 138 Fundal Height: 37 cm Movement: Present  Presentation: Vertex  General:  Alert, oriented and cooperative. Patient is in no acute distress.  Skin: Skin is warm and dry. No rash noted.   Cardiovascular: Normal heart rate noted  Respiratory: Normal respiratory effort, no problems with respiration noted  Abdomen: Soft, gravid, appropriate for gestational age.  Pain/Pressure: Absent     Pelvic: Cervical exam deferred Dilation: 1.5 Effacement (%): 50 Station: -2  Extremities: Normal range of motion.  Edema: None  Mental Status: Normal mood and affect. Normal behavior. Normal judgment and thought content.   Assessment and Plan:  Pregnancy: [id number] 340w3d. AMA (advanced maternal age) multigravida 3539+first trimester Pt amenable to 39wk iol. Pt set up for 3/14 am 1/22: 80%, 2259gm, ac 98%, afi 19.5, repeat PRN  2. [redacted] weeks gestation of  pregnancy  3. Group B streptococcal bacteriuria Tx in labor  Term labor symptoms and general obstetric precautions including but not limited to vaginal bleeding, contractions, leaking of fluid and fetal movement were reviewed in detail with the patient. Please refer to After Visit Summary for other counseling recommendations.   No follow-ups on file.  Future Appointments  Date Time Provider [place]/03/2023  1:30 PM Anyanwu, UgSallyanne HaversMD [facility]  02/07/2023  2:30 PM PiAletha HalimMD [facility]  02/09/2023  7:00 AM [facility] SCHED ROOM [facility] None    ChAletha HalimMD

## 2023-01-24 ENCOUNTER — Encounter: Payer: Medicaid Other | Admitting: Obstetrics and Gynecology

## 2023-01-25 ENCOUNTER — Other Ambulatory Visit: Payer: Self-pay

## 2023-01-25 ENCOUNTER — Inpatient Hospital Stay (HOSPITAL_COMMUNITY)
Admission: AD | Admit: 2023-01-25 | Discharge: 2023-01-27 | DRG: 798 | Disposition: A | Discharge status: Home / Self Care | Payer: Medicaid Other | Attending: Obstetrics and Gynecology | Admitting: Obstetrics and Gynecology

## 2023-01-25 ENCOUNTER — Encounter (HOSPITAL_COMMUNITY): Payer: Self-pay | Admitting: Obstetrics and Gynecology

## 2023-01-25 DIAGNOSIS — N838 Other noninflammatory disorders of ovary, fallopian tube and broad ligament: Secondary | ICD-10-CM | POA: Diagnosis present

## 2023-01-25 DIAGNOSIS — N135 Crossing vessel and stricture of ureter without hydronephrosis: Secondary | ICD-10-CM | POA: Diagnosis present

## 2023-01-25 DIAGNOSIS — Z302 Encounter for sterilization: Secondary | ICD-10-CM

## 2023-01-25 DIAGNOSIS — O26893 Other specified pregnancy related conditions, third trimester: Secondary | ICD-10-CM | POA: Diagnosis present

## 2023-01-25 DIAGNOSIS — O4292 Full-term premature rupture of membranes, unspecified as to length of time between rupture and onset of labor: Principal | ICD-10-CM | POA: Diagnosis present

## 2023-01-25 DIAGNOSIS — O09523 Supervision of elderly multigravida, third trimester: Secondary | ICD-10-CM

## 2023-01-25 DIAGNOSIS — Z3A37 37 weeks gestation of pregnancy: Secondary | ICD-10-CM

## 2023-01-25 DIAGNOSIS — O3483 Maternal care for other abnormalities of pelvic organs, third trimester: Secondary | ICD-10-CM | POA: Diagnosis present

## 2023-01-25 DIAGNOSIS — O4202 Full-term premature rupture of membranes, onset of labor within 24 hours of rupture: Secondary | ICD-10-CM

## 2023-01-25 DIAGNOSIS — O99892 Other specified diseases and conditions complicating childbirth: Secondary | ICD-10-CM | POA: Diagnosis present

## 2023-01-25 DIAGNOSIS — O99820 Streptococcus B carrier state complicating pregnancy: Secondary | ICD-10-CM

## 2023-01-25 DIAGNOSIS — O99824 Streptococcus B carrier state complicating childbirth: Secondary | ICD-10-CM | POA: Diagnosis present

## 2023-01-25 DIAGNOSIS — O09521 Supervision of elderly multigravida, first trimester: Secondary | ICD-10-CM

## 2023-01-25 DIAGNOSIS — R8271 Bacteriuria: Secondary | ICD-10-CM | POA: Diagnosis present

## 2023-01-25 DIAGNOSIS — Q6210 Congenital occlusion of ureter, unspecified: Secondary | ICD-10-CM

## 2023-01-25 LAB — CBC
HCT: 35.6 % — ABNORMAL LOW (ref 36.0–46.0)
Hemoglobin: 11.8 g/dL — ABNORMAL LOW (ref 12.0–15.0)
MCH: 30.5 pg (ref 26.0–34.0)
MCHC: 33.1 g/dL (ref 30.0–36.0)
MCV: 92 fL (ref 80.0–100.0)
Platelets: 211 10*3/uL (ref 150–400)
RBC: 3.87 MIL/uL (ref 3.87–5.11)
RDW: 13.3 % (ref 11.5–15.5)
WBC: 11.5 10*3/uL — ABNORMAL HIGH (ref 4.0–10.5)
nRBC: 0 % (ref 0.0–0.2)

## 2023-01-25 LAB — TYPE AND SCREEN
ABO/RH(D): O POS
Antibody Screen: NEGATIVE

## 2023-01-25 LAB — RPR: RPR Ser Ql: NONREACTIVE

## 2023-01-25 LAB — POCT FERN TEST: POCT Fern Test: POSITIVE

## 2023-01-25 MED ORDER — ONDANSETRON HCL 4 MG/2ML IJ SOLN: 4.0000 mg | INTRAMUSCULAR | Status: DC | PRN

## 2023-01-25 MED ORDER — OXYTOCIN-SODIUM CHLORIDE 30-0.9 UT/500ML-% IV SOLN
2.5000 [IU]/h | INTRAVENOUS | Status: DC
  Administered 2023-01-25: 2.5 [IU]/h via INTRAVENOUS
  Filled 2023-01-25: qty 500

## 2023-01-25 MED ORDER — BENZOCAINE-MENTHOL 20-0.5 % EX AERO
1.0000 | INHALATION_SPRAY | CUTANEOUS | Status: DC | PRN
  Administered 2023-01-27: 1 via TOPICAL
  Filled 2023-01-25: qty 56

## 2023-01-25 MED ORDER — OXYTOCIN BOLUS FROM INFUSION
333.0000 mL | Freq: Once | INTRAVENOUS | Status: AC
  Administered 2023-01-25: 333 mL via INTRAVENOUS

## 2023-01-25 MED ORDER — SIMETHICONE 80 MG PO CHEW: 80.0000 mg | CHEWABLE_TABLET | ORAL | Status: DC | PRN

## 2023-01-25 MED ORDER — PENICILLIN G POT IN DEXTROSE 60000 UNIT/ML IV SOLN
3.0000 10*6.[IU] | INTRAVENOUS | Status: DC
  Administered 2023-01-25: 3 10*6.[IU] via INTRAVENOUS
  Filled 2023-01-25 (×3): qty 50

## 2023-01-25 MED ORDER — LACTATED RINGERS IV SOLN: 500.0000 mL | INTRAVENOUS | Status: DC | PRN

## 2023-01-25 MED ORDER — SOD CITRATE-CITRIC ACID 500-334 MG/5ML PO SOLN: 30.0000 mL | ORAL | Status: DC | PRN

## 2023-01-25 MED ORDER — IBUPROFEN 600 MG PO TABS
600.0000 mg | ORAL_TABLET | Freq: Four times a day (QID) | ORAL | Status: DC
  Administered 2023-01-26 – 2023-01-27 (×6): 600 mg via ORAL
  Filled 2023-01-25 (×6): qty 1

## 2023-01-25 MED ORDER — LACTATED RINGERS IV SOLN: INTRAVENOUS | Status: DC

## 2023-01-25 MED ORDER — ONDANSETRON HCL 4 MG PO TABS: 4.0000 mg | ORAL_TABLET | ORAL | Status: DC | PRN

## 2023-01-25 MED ORDER — COCONUT OIL OIL: 1.0000 | TOPICAL_OIL | Status: DC | PRN

## 2023-01-25 MED ORDER — MISOPROSTOL 25 MCG QUARTER TABLET
25.0000 ug | ORAL_TABLET | Freq: Once | ORAL | Status: AC
  Administered 2023-01-25: 25 ug via ORAL
  Filled 2023-01-25 (×2): qty 1

## 2023-01-25 MED ORDER — SODIUM CHLORIDE 0.9 % IV SOLN: INTRAVENOUS | Status: DC | PRN

## 2023-01-25 MED ORDER — SODIUM CHLORIDE 0.9% FLUSH: 3.0000 mL | INTRAVENOUS | Status: DC | PRN

## 2023-01-25 MED ORDER — ACETAMINOPHEN 325 MG PO TABS
650.0000 mg | ORAL_TABLET | ORAL | Status: DC | PRN
  Administered 2023-01-25 – 2023-01-27 (×4): 650 mg via ORAL
  Filled 2023-01-25 (×4): qty 2

## 2023-01-25 MED ORDER — DIBUCAINE (PERIANAL) 1 % EX OINT: 1.0000 | TOPICAL_OINTMENT | CUTANEOUS | Status: DC | PRN

## 2023-01-25 MED ORDER — FENTANYL CITRATE (PF) 100 MCG/2ML IJ SOLN
50.0000 ug | INTRAMUSCULAR | Status: DC | PRN
  Administered 2023-01-25 (×2): 50 ug via INTRAVENOUS
  Filled 2023-01-25 (×2): qty 2

## 2023-01-25 MED ORDER — TERBUTALINE SULFATE 1 MG/ML IJ SOLN: 0.2500 mg | Freq: Once | INTRAMUSCULAR | Status: DC | PRN

## 2023-01-25 MED ORDER — ONDANSETRON HCL 4 MG/2ML IJ SOLN
4.0000 mg | Freq: Four times a day (QID) | INTRAMUSCULAR | Status: DC | PRN
  Administered 2023-01-25: 4 mg via INTRAVENOUS
  Filled 2023-01-25: qty 2

## 2023-01-25 MED ORDER — MISOPROSTOL 25 MCG QUARTER TABLET
25.0000 ug | ORAL_TABLET | Freq: Once | ORAL | Status: AC
  Administered 2023-01-25: 25 ug via VAGINAL
  Filled 2023-01-25 (×2): qty 1

## 2023-01-25 MED ORDER — PRENATAL MULTIVITAMIN CH
1.0000 | ORAL_TABLET | Freq: Every day | ORAL | Status: DC
  Administered 2023-01-27: 1 via ORAL
  Filled 2023-01-25 (×2): qty 1

## 2023-01-25 MED ORDER — SENNOSIDES-DOCUSATE SODIUM 8.6-50 MG PO TABS
2.0000 | ORAL_TABLET | ORAL | Status: DC
  Administered 2023-01-26 – 2023-01-27 (×2): 2 via ORAL
  Filled 2023-01-25 (×2): qty 2

## 2023-01-25 MED ORDER — ACETAMINOPHEN 325 MG PO TABS: 650.0000 mg | ORAL_TABLET | ORAL | Status: DC | PRN

## 2023-01-25 MED ORDER — SODIUM CHLORIDE 0.9 % IV SOLN
5.0000 10*6.[IU] | Freq: Once | INTRAVENOUS | Status: AC
  Administered 2023-01-25: 5 10*6.[IU] via INTRAVENOUS
  Filled 2023-01-25: qty 5

## 2023-01-25 MED ORDER — LIDOCAINE HCL (PF) 1 % IJ SOLN: 30.0000 mL | INTRAMUSCULAR | Status: DC | PRN

## 2023-01-25 MED ORDER — WITCH HAZEL-GLYCERIN EX PADS: 1.0000 | MEDICATED_PAD | CUTANEOUS | Status: DC | PRN

## 2023-01-25 MED ORDER — DIPHENHYDRAMINE HCL 25 MG PO CAPS: 25.0000 mg | ORAL_CAPSULE | Freq: Four times a day (QID) | ORAL | Status: DC | PRN

## 2023-01-25 MED ORDER — ZOLPIDEM TARTRATE 5 MG PO TABS: 5.0000 mg | ORAL_TABLET | Freq: Every evening | ORAL | Status: DC | PRN

## 2023-01-25 MED ORDER — SODIUM CHLORIDE 0.9% FLUSH: 3.0000 mL | Freq: Two times a day (BID) | INTRAVENOUS | Status: DC

## 2023-01-25 MED ORDER — OXYCODONE HCL 5 MG PO TABS: 5.0000 mg | ORAL_TABLET | ORAL | Status: DC | PRN

## 2023-01-25 NOTE — H&P (Addendum)
OBSTETRIC ADMISSION HISTORY AND PHYSICAL  Debbie Jackson is a 37 y.o. female [phone] with IUP at 19w5dby early [place] for SROM. She reports +FMs, No LOF, no VB, no blurry vision, headaches or peripheral edema, and RUQ pain.  She plans on breast feeding. She would like BTL for contraception. 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   Procedure: Irrigation and debridement, complex repair of left long finger with primary closure, open reduction and percutaneous pinning, and nail bed repair, left long finger.;  Surgeon: RNicholes Stairs MD;  Location: [facility]  Service: Orthopedics;  Laterality: Left;   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   Procedure: Left long finger revision amputation;  Surgeon: RNicholes Stairs MD;  [facility]  Service: Orthopedics;  Laterality: Left;   ECTOPIC PREGNANCY SURGERY  yrs ago   right tube removed after burst tubal pregnancy   KIDNEY SURGERY     multiple kidney surgeries, born with double kidney   left finger necrosis      Obstetrical History: OB History     Gravida  7   Para  3   Term  3   Preterm      AB  3   Living  3      SAB  2   IAB  0   Ectopic  1   Multiple  0   Live Births  3           Social History Social History   Socioeconomic History   Marital status: Single    Spouse name: Not on file   Number of children: Not on file   Years of education:  Not on file   Highest education level: Not on file  Occupational History    Comment: day care teacher  Tobacco Use   Smoking status: Never   Smokeless tobacco: Never  Vaping Use   Vaping Use: Never used  Substance and Sexual Activity   Alcohol use: No   Drug use: No   Sexual activity: Yes    Birth control/protection: None  Other Topics Concern   Not on file  Social History Narrative   Not on file   Social Determinants of Health   Financial Resource Strain: Not on file  Food Insecurity: Not on file  Transportation Needs: Not on file  Physical Activity: Not on file  Stress: Not on file  Social Connections: Not on file    Family History: Family History  Problem Relation Age of Onset   Hypertension Mother    Hypertension Father    Cancer Maternal Grandfather    Cancer Paternal Grandfather    Alcohol abuse Neg Hx    Arthritis Neg Hx    Asthma Neg Hx    Birth defects Neg Hx    COPD Neg Hx    Depression Neg Hx    Diabetes Neg Hx    Drug  abuse Neg Hx    Early death Neg Hx    Hearing loss Neg Hx    Heart disease Neg Hx    Hyperlipidemia Neg Hx    Kidney disease Neg Hx    Learning disabilities Neg Hx    Mental illness Neg Hx    Mental retardation Neg Hx    Miscarriages / Stillbirths Neg Hx    Stroke Neg Hx    Vision loss Neg Hx    Varicose Veins Neg Hx     Allergies: No Known Allergies  Medications Prior to Admission  Medication Sig Dispense Refill Last Dose   Prenatal Vit-Fe Fumarate-FA (PRENATAL VITAMINS PO) Take 1 tablet by mouth daily.        Review of Systems   All systems reviewed and negative except as stated in HPI  Blood pressure 134/68, pulse 77, temperature 97.9 F (36.6 C), resp. rate 18, SpO2 98 %. General appearance: alert, cooperative, appears stated age, and no distress Lungs: clear to auscultation bilaterally Heart: regular rate and rhythm Abdomen: soft, non-tender; bowel sounds normal Pelvic: deferred Extremities: no edema, no sign  of DVT Presentation: cephalic Fetal monitoringBaseline: 140 bpm, Variability: Good {> 6 bpm), Accelerations: Reactive, and Decelerations: Absent Uterine activityFrequency: Every 4 minutes     Prenatal labs: ABO, Rh: O/Positive/-- (08/30 1554) Antibody: Negative (08/30 1554) Rubella: 2.50 (08/30 1554) RPR: Non Reactive (12/27 0908)  HBsAg: Negative (08/30 1554)  HIV: Non Reactive (12/27 0908)  GBS:   GBS bacteruria this pregnancy 1 hr Glucola Third trimester 83/165/140  Genetic screening  NIPS: low risk female, AFP: neg, Horizon: Neg x 4 Anatomy US normal  Prenatal Transfer Tool  Maternal Diabetes: No Genetic Screening: Normal Maternal Ultrasounds/Referrals: Normal Fetal Ultrasounds or other Referrals:  None Maternal Substance Abuse:  No Significant Maternal Medications:  None Significant Maternal Lab Results:  Group B Strep positive (GBS bacteruria in pregnancy)  Number of Prenatal Visits:greater than 3 verified prenatal visits Other Comments:  None  No results found for this or any previous visit (from the past 24 hour(s)).  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 [id number] at 51w5dhere for SROM.   #Labor: SROM 1200. Will augment cervical ripening with dual cytotec.  #Pain: Pt not planning on epidural. Considering IV pain medication, nitrous. #FWB: Cat I #ID:  GBS positive - IV PCN 5 mill units  then 325ml units q4h #MOF: breast #MOC:undecided #Circ:  N/a  LyDarlen RoundMD PGY-1 01/25/2023, 1:34 PM   Attestation of Supervision of Student:  I confirm that I have verified the information documented in the  resident's  note and that I have also personally reperformed the history, physical exam and all medical decision making activities.  I have verified that all services and findings are accurately documented in this student's note; and I agree with management and plan as outlined in the documentation. I have also made any necessary editorial changes.  HeMarcille BuffyNP, CNM  01/25/23  4:42  PM    

## 2023-01-25 NOTE — MAU Note (Signed)

## 2023-01-25 NOTE — MAU Note (Signed)
.  Debbie Jackson is a 37 y.o. at 58w5dhere in MAU reporting: Pt reports gush of clear fluid at 12. Denies pain. Denies vaginal bleeding. Reports +FM   Onset of complaint: TODAY 12 lunch  Pain score: denies There were no vitals filed for this visit.   Lab orders placed from triage:   none

## 2023-01-25 NOTE — Discharge Summary (Signed)
Postpartum Discharge Summary    Patient Name: Debbie Jackson DOB: [birth year] MRN: [id number]  Date of admission: 01/25/2023 Delivery date:01/25/2023  Delivering provider: Stormy Card  Date of discharge: 01/27/2023  Admitting diagnosis: AMA (advanced maternal age) multigravida 27+, third trimester [O09.523] Intrauterine pregnancy: [redacted]w[redacted]d    Secondary diagnosis:  Principal Problem:   AMA (advanced maternal age) multigravida 35+, third trimester Active Problems:   Group B streptococcal bacteriuria   Congenital ureteral obstruction   SVD (spontaneous vaginal delivery)  Additional problems: n/a    Discharge diagnosis: Term Pregnancy Delivered                                              Post partum procedures: n/a Augmentation: Cytotec Complications: None  Hospital course: Induction of Labor With Vaginal Delivery   37y.o. yo [id number] [id number] admitted to the hospital 01/25/2023 for induction of labor.  Indication for induction: PROM.  Patient had an labor course complicated by: none Membrane Rupture Time/Date: 12:00 PM ,01/25/2023   Delivery Method:Vaginal, Spontaneous  Episiotomy: None  Lacerations:  None  Details of delivery can be found in separate delivery note.  Patient had a postpartum course complicated by none. Patient is discharged home 01/27/23.  Newborn Data: Birth date:01/25/2023  Birth time:7:18 PM  Gender:Female  Living status:Living  Apgars:8 ,9  Weight:3572 g   Magnesium Sulfate received: No BMZ received: No Rhophylac:N/A MMR:N/A; rubella Immune T-DaP:Given prenatally Flu: No Transfusion:No  Physical exam  Vitals:   01/26/23 1446 01/26/23 1943 01/27/23 0005 01/27/23 0456  BP: 106/72 125/69 108/63 115/61  Pulse: (!) 59 67 (!) 56 (!) 54  Resp: '20 20 20 20  '$ Temp: 98.3 F (36.8 C) 98.2 F (36.8 C) 98.1 F (36.7 C) 97.8 F (36.6 C)  TempSrc: Oral Oral Oral Oral  SpO2: 97% 95% 96% 100%  Weight:      Height:       General: alert,  cooperative, and no distress Lochia: appropriate Uterine Fundus: firm Incision: Healing well with no significant drainage, No significant erythema, Dressing is clean, dry, and intact DVT Evaluation: No evidence of DVT seen on physical exam. Labs: Lab Results  Component Value Date   WBC 11.5 (H) 01/25/2023   HGB 11.8 (L) 01/25/2023   HCT 35.6 (L) 01/25/2023   MCV 92.0 01/25/2023   PLT 211 01/25/2023      Latest Ref Rng & Units 09/21/2022    3:40 PM  CMP  Glucose 70 - 99 mg/dL 83   BUN 6 - 20 mg/dL 7   Creatinine 0.57 - 1.00 mg/dL 0.59   Sodium 134 - 144 mmol/L 138   Potassium 3.5 - 5.2 mmol/L 3.6   Chloride 96 - 106 mmol/L 101   CO2 20 - 29 mmol/L 22   Calcium 8.7 - 10.2 mg/dL 9.0   Total Protein 6.0 - 8.5 g/dL 6.6   Total Bilirubin 0.0 - 1.2 mg/dL 0.8   Alkaline Phos 44 - 121 IU/L 79   AST 0 - 40 IU/L 18   ALT 0 - 32 IU/L 15    Edinburgh Score:    01/27/2023    4:55 AM  Edinburgh Postnatal Depression Scale Screening Tool  I have been able to laugh and see the funny side of things. 0  I have looked forward with enjoyment to things. 0  I have blamed  myself unnecessarily when things went wrong. 1  I have been anxious or worried for no good reason. 0  I have felt scared or panicky for no good reason. 0  Things have been getting on top of me. 0  I have been so unhappy that I have had difficulty sleeping. 0  I have felt sad or miserable. 1  I have been so unhappy that I have been crying. 0  The thought of harming myself has occurred to me. 0  Edinburgh Postnatal Depression Scale Total 2     After visit meds:  Allergies as of 01/27/2023   No Known Allergies      Medication List     TAKE these medications    acetaminophen 325 MG tablet Commonly known as: Tylenol Take 2 tablets (650 mg total) by mouth every 4 (four) hours as needed (for pain scale < 4).   benzocaine-Menthol 20-0.5 % Aero Commonly known as: DERMOPLAST Apply 1 Application topically as needed for  irritation (perineal discomfort).   ibuprofen 600 MG tablet Commonly known as: ADVIL Take 1 tablet (600 mg total) by mouth every 6 (six) hours. What changed:  medication strength how much to take when to take this reasons to take this   oxyCODONE 5 MG immediate release tablet Commonly known as: Oxy IR/ROXICODONE Take 1 tablet (5 mg total) by mouth every 4 (four) hours as needed (pain scale 4-7).   PRENATAL VITAMINS PO Take 1 tablet by mouth daily.   senna-docusate 8.6-50 MG tablet Commonly known as: Senokot-S Take 2 tablets by mouth daily.         Discharge home in stable condition Infant Feeding: Bottle and Breast Infant Disposition:home with mother Discharge instruction: per After Visit Summary and Postpartum booklet. Activity: Advance as tolerated. Pelvic rest for 6 weeks.  Diet: routine diet Future Appointments: Future Appointments  Date Time Provider [place]  03/08/2023  3:10 PM Donnamae Jude, MD [facility]   Follow up Visit:  The following message was sent to [facility] by Mikki Santee, MD  Please schedule this patient for an inperson postpartum visit in 6 weeks with the following provider: Any provider. Additional Postpartum F/U: none High risk pregnancy complicated by:  AMA Delivery mode:  Vaginal, Spontaneous  Anticipated Birth Control:  BTL planned for PP   01/27/2023 Shelda Pal, DO

## 2023-01-26 ENCOUNTER — Encounter (HOSPITAL_COMMUNITY): Payer: Self-pay | Admitting: Obstetrics and Gynecology

## 2023-01-26 ENCOUNTER — Other Ambulatory Visit: Payer: Self-pay

## 2023-01-26 ENCOUNTER — Encounter (HOSPITAL_COMMUNITY)
Admission: AD | Disposition: A | Discharge status: Home / Self Care | Payer: Self-pay | Source: Home / Self Care | Attending: Obstetrics and Gynecology

## 2023-01-26 ENCOUNTER — Inpatient Hospital Stay (HOSPITAL_COMMUNITY): Payer: Medicaid Other | Admitting: Anesthesiology

## 2023-01-26 DIAGNOSIS — Z302 Encounter for sterilization: Secondary | ICD-10-CM

## 2023-01-26 HISTORY — PX: TUBAL LIGATION: SHX77

## 2023-01-26 SURGERY — LIGATION, FALLOPIAN TUBE, POSTPARTUM
Anesthesia: Spinal | Laterality: Bilateral

## 2023-01-26 MED ORDER — KETOROLAC TROMETHAMINE 30 MG/ML IJ SOLN: 30.0000 mg | Freq: Once | INTRAMUSCULAR | Status: DC | PRN

## 2023-01-26 MED ORDER — ONDANSETRON HCL 4 MG/2ML IJ SOLN
INTRAMUSCULAR | Status: AC
  Filled 2023-01-26: qty 2

## 2023-01-26 MED ORDER — SODIUM CHLORIDE 0.9% FLUSH: 3.0000 mL | INTRAVENOUS | Status: DC | PRN

## 2023-01-26 MED ORDER — FENTANYL CITRATE (PF) 100 MCG/2ML IJ SOLN
INTRAMUSCULAR | Status: DC | PRN
  Administered 2023-01-26: 20 ug via INTRATHECAL

## 2023-01-26 MED ORDER — DEXMEDETOMIDINE HCL IN NACL 80 MCG/20ML IV SOLN
INTRAVENOUS | Status: DC | PRN
  Administered 2023-01-26: 8 ug via INTRAVENOUS
  Administered 2023-01-26 (×3): 4 ug via INTRAVENOUS

## 2023-01-26 MED ORDER — NALOXONE HCL 0.4 MG/ML IJ SOLN: 0.4000 mg | INTRAMUSCULAR | Status: DC | PRN

## 2023-01-26 MED ORDER — SCOPOLAMINE 1 MG/3DAYS TD PT72: 1.0000 | MEDICATED_PATCH | Freq: Once | TRANSDERMAL | Status: DC

## 2023-01-26 MED ORDER — NALOXONE HCL 4 MG/10ML IJ SOLN: 1.0000 ug/kg/h | INTRAVENOUS | Status: DC | PRN

## 2023-01-26 MED ORDER — STERILE WATER FOR IRRIGATION IR SOLN
Status: DC | PRN
  Administered 2023-01-26: 1000 mL

## 2023-01-26 MED ORDER — PHENYLEPHRINE HCL-NACL 20-0.9 MG/250ML-% IV SOLN
INTRAVENOUS | Status: DC | PRN
  Administered 2023-01-26: 60 ug/min via INTRAVENOUS

## 2023-01-26 MED ORDER — CHLOROPROCAINE HCL (PF) 3 % IJ SOLN: INTRAMUSCULAR | Status: DC | PRN

## 2023-01-26 MED ORDER — MIDAZOLAM HCL 5 MG/5ML IJ SOLN
INTRAMUSCULAR | Status: DC | PRN
  Administered 2023-01-26: 1 mg via INTRAVENOUS
  Administered 2023-01-26 (×2): .5 mg via INTRAVENOUS

## 2023-01-26 MED ORDER — MIDAZOLAM HCL 2 MG/2ML IJ SOLN
INTRAMUSCULAR | Status: AC
  Filled 2023-01-26: qty 2

## 2023-01-26 MED ORDER — BUPIVACAINE IN DEXTROSE 0.75-8.25 % IT SOLN
INTRATHECAL | Status: DC | PRN
  Administered 2023-01-26: 1.6 mL via INTRATHECAL

## 2023-01-26 MED ORDER — DEXAMETHASONE SODIUM PHOSPHATE 4 MG/ML IJ SOLN
INTRAMUSCULAR | Status: AC
  Filled 2023-01-26: qty 1

## 2023-01-26 MED ORDER — DIPHENHYDRAMINE HCL 50 MG/ML IJ SOLN: 12.5000 mg | INTRAMUSCULAR | Status: DC | PRN

## 2023-01-26 MED ORDER — ONDANSETRON HCL 4 MG/2ML IJ SOLN: 4.0000 mg | Freq: Three times a day (TID) | INTRAMUSCULAR | Status: DC | PRN

## 2023-01-26 MED ORDER — FENTANYL CITRATE (PF) 100 MCG/2ML IJ SOLN
INTRAMUSCULAR | Status: AC
  Filled 2023-01-26: qty 2

## 2023-01-26 MED ORDER — BUPIVACAINE HCL (PF) 0.25 % IJ SOLN
INTRAMUSCULAR | Status: AC
  Filled 2023-01-26: qty 30

## 2023-01-26 MED ORDER — HYDROCODONE-ACETAMINOPHEN 10-325 MG PO TABS: 1.0000 | ORAL_TABLET | ORAL | Status: DC | PRN

## 2023-01-26 MED ORDER — DEXAMETHASONE SODIUM PHOSPHATE 4 MG/ML IJ SOLN
INTRAMUSCULAR | Status: DC | PRN
  Administered 2023-01-26: 8 mg via INTRAVENOUS

## 2023-01-26 MED ORDER — MEPERIDINE HCL 25 MG/ML IJ SOLN: 6.2500 mg | INTRAMUSCULAR | Status: DC | PRN

## 2023-01-26 MED ORDER — DEXMEDETOMIDINE HCL IN NACL 80 MCG/20ML IV SOLN
INTRAVENOUS | Status: AC
  Filled 2023-01-26: qty 20

## 2023-01-26 MED ORDER — DIPHENHYDRAMINE HCL 25 MG PO CAPS: 25.0000 mg | ORAL_CAPSULE | ORAL | Status: DC | PRN

## 2023-01-26 MED ORDER — BUPIVACAINE HCL (PF) 0.25 % IJ SOLN
INTRAMUSCULAR | Status: DC | PRN
  Administered 2023-01-26: 30 mL

## 2023-01-26 MED ORDER — FENTANYL CITRATE (PF) 100 MCG/2ML IJ SOLN: 25.0000 ug | INTRAMUSCULAR | Status: DC | PRN

## 2023-01-26 MED ORDER — FENTANYL CITRATE (PF) 100 MCG/2ML IJ SOLN
INTRAMUSCULAR | Status: DC | PRN
  Administered 2023-01-26: 25 ug via INTRAVENOUS
  Administered 2023-01-26: 50 ug via INTRAVENOUS
  Administered 2023-01-26: 25 ug via INTRAVENOUS

## 2023-01-26 MED ORDER — METOCLOPRAMIDE HCL 10 MG PO TABS
10.0000 mg | ORAL_TABLET | Freq: Once | ORAL | Status: AC
  Administered 2023-01-26: 10 mg via ORAL
  Filled 2023-01-26: qty 1

## 2023-01-26 MED ORDER — LACTATED RINGERS IV SOLN: INTRAVENOUS | Status: DC

## 2023-01-26 MED ORDER — SODIUM CHLORIDE 0.9 % IR SOLN
Status: DC | PRN
  Administered 2023-01-26: 1000 mL

## 2023-01-26 MED ORDER — ONDANSETRON HCL 4 MG/2ML IJ SOLN
INTRAMUSCULAR | Status: DC | PRN
  Administered 2023-01-26: 4 mg via INTRAVENOUS

## 2023-01-26 MED ORDER — LACTATED RINGERS IV SOLN: INTRAVENOUS | Status: DC | PRN

## 2023-01-26 MED ORDER — FAMOTIDINE 20 MG PO TABS
40.0000 mg | ORAL_TABLET | Freq: Once | ORAL | Status: AC
  Administered 2023-01-26: 40 mg via ORAL
  Filled 2023-01-26: qty 2

## 2023-01-26 SURGICAL SUPPLY — 22 items
ADH SKN CLS APL DERMABOND .7 (GAUZE/BANDAGES/DRESSINGS) ×2
BLADE SURG 11 STRL SS (BLADE) ×1 IMPLANT
CLIP FILSHIE TUBAL LIGA STRL (Clip) ×1 IMPLANT
CLOTH BEACON ORANGE TIMEOUT ST (SAFETY) ×1 IMPLANT
DERMABOND ADVANCED .7 DNX12 (GAUZE/BANDAGES/DRESSINGS) IMPLANT
DRSG OPSITE POSTOP 3X4 (GAUZE/BANDAGES/DRESSINGS) ×1 IMPLANT
DURAPREP 26ML APPLICATOR (WOUND CARE) ×1 IMPLANT
GLOVE BIOGEL PI IND STRL 7.0 (GLOVE) ×3 IMPLANT
GLOVE ECLIPSE 7.0 STRL STRAW (GLOVE) ×1 IMPLANT
GOWN STRL REUS W/TWL LRG LVL3 (GOWN DISPOSABLE) ×2 IMPLANT
LIGASURE IMPACT 36 18CM CVD LR (INSTRUMENTS) IMPLANT
NEEDLE HYPO 22GX1.5 SAFETY (NEEDLE) ×1 IMPLANT
NS IRRIG 1000ML POUR BTL (IV SOLUTION) ×1 IMPLANT
PACK ABDOMINAL MINOR (CUSTOM PROCEDURE TRAY) ×1 IMPLANT
PROTECTOR NERVE ULNAR (MISCELLANEOUS) ×1 IMPLANT
SPONGE LAP 4X18 RFD (DISPOSABLE) IMPLANT
SUT VICRYL 0 UR6 27IN ABS (SUTURE) ×1 IMPLANT
SUT VICRYL 4-0 PS2 18IN ABS (SUTURE) ×1 IMPLANT
SYR CONTROL 10ML LL (SYRINGE) ×1 IMPLANT
TOWEL OR 17X24 6PK STRL BLUE (TOWEL DISPOSABLE) ×2 IMPLANT
TRAY FOLEY CATH SILVER 14FR (SET/KITS/TRAYS/PACK) ×1 IMPLANT
WATER STERILE IRR 1000ML POUR (IV SOLUTION) ×1 IMPLANT

## 2023-01-26 NOTE — Op Note (Addendum)
Debbie Jackson 01/26/2023  PREOPERATIVE DIAGNOSIS:  Undesired fertility  POSTOPERATIVE DIAGNOSIS:  Undesired fertility  PROCEDURE:  Postpartum Bilateral Tubal Sterilization using Ligasure   SURGEON:  Caren Macadam, MD  Assistant: Shelda Pal, DO An experienced assistant was required given the standard of surgical care given the complexity of the case.  This assistant was needed for exposure, dissection, suctioning, retraction, instrument exchange and for overall help during the procedure.  ANESTHESIA:  Epidural  COMPLICATIONS:  None immediate.  ESTIMATED BLOOD LOSS:  Less than 20cc.  FLUIDS: 1400 mL LR.  URINE OUTPUT:  167 mL of clear urine.  INDICATIONS: 37 y.o. yo [id number]  with undesired fertility,status post vaginal delivery, desires permanent sterilization. Risks and benefits of procedure discussed with patient including permanence of method, bleeding, infection, injury to surrounding organs and need for additional procedures. Risk failure of 0.5-1% with increased risk of ectopic gestation if pregnancy occurs was also discussed with patient.   FINDINGS:  Normal postpartum uterus. Surgically absent right tube with small tubal remnant at the cornua. Left tube was normal in appearance with small paratubal cyst.    TECHNIQUE:  The patient was taken to the operating room where her epidural anesthesia was dosed up to surgical level and found to be adequate.  She was then placed in the dorsal supine position and prepped and draped in sterile fashion.  After an adequate timeout was performed, attention was turned to the patient's abdomen where a small transverse skin incision was made under the umbilical fold. The incision was taken down to the layer of fascia using the scalpel, and fascia was incised, and extended bilaterally using Mayo scissors. The peritoneum was entered in a sharp fashion.   Prior surgical removal of the right fallopian tube was noted. Attention was  then turned to the patient's uterus, and left fallopian tube was identified and followed out to the fimbriated end.  Ligasure device was used to cauterize and cut the mesosalpinx to proximal end of the fallopian tube, removing 6cm of tube, allowing for bilateral tubal sterilization.    Good hemostasis was noted overall. .The instruments were then removed from the patient's abdomen and the fascial incision was repaired with 0 Vicryl, and the skin was closed with a 4-0 Vicryl subcuticular stitch.  Local analgesia was injected in the periumbilical area. The patient tolerated the procedure well.  Sponge, lap, and needle counts were correct times two.  The patient was then taken to the recovery room awake and in stable condition.   Shelda Pal, DO [place] Fellow, Faculty practice [place] for [place] of Attending Supervision of [place] Fellow: Evaluation and management procedures were performed by the Family Medicine OB Fellow under my supervision.  I was gowned, gloved and actively involved in the case. I have made editorial changes.   Caren Macadam, MD, MPH, ABFM Attending Physician Center for [facility] , [place] [facility]  01/26/23  1:08 PM

## 2023-01-26 NOTE — Anesthesia Postprocedure Evaluation (Signed)
Anesthesia Post Note  Patient: Debbie Jackson  Procedure(s) Performed: POST PARTUM TUBAL LIGATION (Bilateral)     Patient location during evaluation: Mother Baby Anesthesia Type: Spinal Level of consciousness: oriented and awake and alert Pain management: pain level controlled Vital Signs Assessment: post-procedure vital signs reviewed and stable Respiratory status: spontaneous breathing and respiratory function stable Cardiovascular status: blood pressure returned to baseline and stable Postop Assessment: no headache, no backache, no apparent nausea or vomiting and able to ambulate Anesthetic complications: no   No notable events documented.  Last Vitals:  Vitals:   01/26/23 1446 01/26/23 1943  BP: 106/72 125/69  Pulse: (!) 59 67  Resp: 20 20  Temp: 36.8 C 36.8 C  SpO2: 97% 95%    Last Pain:  Vitals:   01/26/23 1944  TempSrc:   PainSc: 5                  Conny Moening P Shivangi Lutz

## 2023-01-26 NOTE — Lactation Note (Signed)
This note was copied from a baby's chart. Lactation Consultation Note  Patient Name: Debbie Jackson [id number] Date: 01/26/2023 Age:36 hours Reason for consult: Initial assessment;Early term 79-38.6wks Mom has 3 other children that she didn't BF. She tried to BF her now 37 yr old but unable to latch. This baby has been latching and BF well. Newborn feeding habits, STS, I&O, positioning, support, props reviewed. Demonstrated flanging lips and chin tug to widen latch. Worked w/mom on Production assistant, radio. Mom encouraged to feed baby 8-12 times/24 hours and with feeding cues.  Encouraged mom to call for assistance if needed. Maternal Data Has patient been taught Hand Expression?: Yes Does the patient have breastfeeding experience prior to this delivery?: Yes How long did the patient breastfeed?: tried w/last child and she wouldn't latch  Feeding    LATCH Score Latch: Grasps breast easily, tongue down, lips flanged, rhythmical sucking.  Audible Swallowing: None  Type of Nipple: Everted at rest and after stimulation  Comfort (Breast/Nipple): Soft / non-tender  Hold (Positioning): Assistance needed to correctly position infant at breast and maintain latch.  LATCH Score: 7   Lactation Tools Discussed/Used    Interventions Interventions: Breast feeding basics reviewed;Adjust position;Assisted with latch;Support pillows;Skin to skin;Position options;Breast massage;Hand express;LC Services brochure;Breast compression  Discharge    Consult Status Consult Status: Follow-up Date: 01/26/23 (in pm) Follow-up type: In-patient    Theodoro Kalata 01/26/2023, 2:32 AM

## 2023-01-26 NOTE — Lactation Note (Signed)
This note was copied from a baby's chart. Lactation Consultation Note  Patient Name: Debbie Jackson [id number] Date: 01/26/2023 Age:36 hours  LC attempted to visit with the birth parent, but she was in surgery. Lactation will follow up later.      Maternal Data    Feeding    LATCH Score                    Lactation Tools Discussed/Used    Interventions    Discharge    Consult Status      Lysbeth Penner 01/26/2023, 1:03 PM

## 2023-01-26 NOTE — Lactation Note (Signed)
This note was copied from a baby's chart. Lactation Consultation Note  Patient Name: Debbie Jackson [id number] Date: 01/26/2023 Age:36 hours Reason for consult: Follow-up assessment;Mother's request;Early term 37-38.6wks;Breastfeeding assistance;Infant weight loss (2.02% WL)  LC entered the room and the birth parent was holding the infant.  Per the birth parent the infant has not eaten since about 1030 this morning.  LC assisted the birth parent with putting the infant to the left breast in the football position.  The infant was uninterested in feeding.  The infant had a stool and the birth parent changed her.  The infant began showing feeding cues.  LC assisted the birth parent in putting the infant to the right breast in the cross-cradle position.  The infant latched with her tongue down, lips were flanged, sucking was rhythmic due to stimulation, and jaw extensions were noted.  The infant fed for 7 min while the LC was in the room and was still feeding when the [place] left.  The birth parent will call RN/LC for assistance with breastfeeding.    LATCH Score Latch: Grasps breast easily, tongue down, lips flanged, rhythmical sucking.  Audible Swallowing: A few with stimulation  Type of Nipple: Everted at rest and after stimulation  Comfort (Breast/Nipple): Soft / non-tender  Hold (Positioning): Assistance needed to correctly position infant at breast and maintain latch.  LATCH Score: 8  Interventions Interventions: Assisted with latch;Adjust position;Support pillows  Consult Status Consult Status: Follow-up Date: 01/27/23 Follow-up type: In-patient   Lysbeth Penner 01/26/2023, 4:29 PM

## 2023-01-26 NOTE — Progress Notes (Signed)
Postpartum Tubal Consent  Risks of procedure discussed with patient including but not limited to: risk of regret, permanence of method, bleeding, infection, injury to surrounding organs and need for additional procedures.  Failure risk of 1-2% with increased risk of ectopic gestation if pregnancy occurs was also discussed with patient. We discussed all forms of LARC contraception and interval tubal sterilization as well.  The patient desired to continued with immediate PP tubal sterilization by salpingectomy, partial salpingectomy or [place] clip.  Patient has had previous ectopic She agreed with the proposed plan, giving informed written consent for the procedures.  Patient has been NPO since midnight she will remain NPO for procedure. Anesthesia and OR aware.  Preoperative prophylactic antibiotics and SCDs ordered on call to the OR.  To OR when ready.  Caren Macadam, MD, MPH, ABFM, [facility] Attending Physician Center for [facility]

## 2023-01-26 NOTE — Progress Notes (Signed)
POSTPARTUM PROGRESS NOTE  Post Partum Day 1  Subjective:  Debbie Jackson is a 37 y.o. [id number] s/p SVD at [redacted]w[redacted]d  She reports she is doing well. No acute events overnight. She denies any problems with ambulating, voiding. Pt is NPO, tubal ligation scheduled for 1045. Denies nausea or vomiting.  Pain is well controlled.  Lochia is light in flow, red tinge.  Objective: Blood pressure 119/64, pulse (!) 57, temperature 98.2 F (36.8 C), temperature source Oral, resp. rate 16, height '5\' 6"'$  (1.676 m), weight 84.8 kg, SpO2 98 %, unknown if currently breastfeeding.  Physical Exam:  General: alert, cooperative and no distress Chest: no respiratory distress Heart:regular rate and rhythm. No murmurs. distal pulses intact Abdomen: soft, nontender. Normoactive BS Uterine Fundus: firm, appropriately tender DVT Evaluation: No calf swelling or tenderness Extremities: no edema Skin: warm, dry  Recent Labs    01/25/23 1338  HGB 11.8*  HCT 35.6*    Assessment/Plan: Debbie RELLERis a 37y.o. [id number]/p SVD at 352w5d PPD#1 - Doing well. Pain controlled with ibuprofen and tylenol  Routine postpartum care Vitals WNL Contraception: Tubal Ligation @ 1045 Feeding: breast Dispo: Plan for discharge likely tomorrow morning.  All questions answered at this time   LOS: 1 day   Debbie Boehm PA-S 01/26/2023, 7:24 AM

## 2023-01-26 NOTE — Transfer of Care (Signed)
Immediate Anesthesia Transfer of Care Note  Patient: Debbie Jackson  Procedure(s) Performed: POST PARTUM TUBAL LIGATION (Bilateral)  Patient Location: PACU  Anesthesia Type:Spinal  Level of Consciousness: awake, alert , and oriented  Airway & Oxygen Therapy: Patient Spontanous Breathing  Post-op Assessment: Report given to RN and Post -op Vital signs reviewed and stable  Post vital signs: Reviewed and stable  Last Vitals:  Vitals Value Taken Time  BP 102/57 01/26/23 1245  Temp    Pulse 60 01/26/23 1248  Resp 12 01/26/23 1248  SpO2 92 % 01/26/23 1248  Vitals shown include unvalidated device data.  Last Pain:  Vitals:   01/26/23 0900  TempSrc: Oral  PainSc: 0-No pain         Complications: No notable events documented.

## 2023-01-26 NOTE — Anesthesia Preprocedure Evaluation (Signed)
Anesthesia Evaluation  Patient identified by MRN, date of birth, ID band Patient awake    Reviewed: Allergy & Precautions, NPO status , Patient's Chart, lab work & pertinent test results  Airway Mallampati: II  TM Distance: >3 FB Neck ROM: Full    Dental no notable dental hx.    Pulmonary neg pulmonary ROS   Pulmonary exam normal        Cardiovascular negative cardio ROS  Rhythm:Regular Rate:Normal     Neuro/Psych  Headaches  negative psych ROS   GI/Hepatic Neg liver ROS,GERD  ,,  Endo/Other  negative endocrine ROS    Renal/GU   negative genitourinary   Musculoskeletal negative musculoskeletal ROS (+)    Abdominal Normal abdominal exam  (+)   Peds  Hematology negative hematology ROS (+)   Anesthesia Other Findings   Reproductive/Obstetrics Post partum tubal                             Anesthesia Physical Anesthesia Plan  ASA: 2  Anesthesia Plan: Spinal   Post-op Pain Management:    Induction: Intravenous  PONV Risk Score and Plan: 2 and Ondansetron, Dexamethasone and Treatment may vary due to age or medical condition  Airway Management Planned: Nasal Cannula, Natural Airway and Simple Face Mask  Additional Equipment: None  Intra-op Plan:   Post-operative Plan:   Informed Consent: I have reviewed the patients History and Physical, chart, labs and discussed the procedure including the risks, benefits and alternatives for the proposed anesthesia with the patient or authorized representative who has indicated his/her understanding and acceptance.     Dental advisory given  Plan Discussed with: CRNA  Anesthesia Plan Comments: (Lab Results      Component                Value               Date                      WBC                      11.5 (H)            01/25/2023                HGB                      11.8 (L)            01/25/2023                HCT                       35.6 (L)            01/25/2023                MCV                      92.0                01/25/2023                PLT                      211  01/25/2023           )       Anesthesia Quick Evaluation

## 2023-01-27 ENCOUNTER — Other Ambulatory Visit (HOSPITAL_COMMUNITY): Payer: Self-pay

## 2023-01-27 LAB — BIRTH TISSUE RECOVERY COLLECTION (PLACENTA DONATION)

## 2023-01-27 MED ORDER — BENZOCAINE-MENTHOL 20-0.5 % EX AERO
1.0000 | INHALATION_SPRAY | CUTANEOUS | 0 refills | Status: AC | PRN
  Filled 2023-01-27: qty 78, fill #0

## 2023-01-27 MED ORDER — SENNOSIDES-DOCUSATE SODIUM 8.6-50 MG PO TABS
2.0000 | ORAL_TABLET | ORAL | 0 refills | Status: AC
  Filled 2023-01-27: qty 30, 15d supply, fill #0

## 2023-01-27 MED ORDER — ACETAMINOPHEN 325 MG PO TABS
650.0000 mg | ORAL_TABLET | ORAL | 0 refills | Status: AC | PRN
  Filled 2023-01-27: qty 30, 3d supply, fill #0

## 2023-01-27 MED ORDER — IBUPROFEN 600 MG PO TABS
600.0000 mg | ORAL_TABLET | Freq: Four times a day (QID) | ORAL | 0 refills | Status: AC
  Filled 2023-01-27: qty 30, 8d supply, fill #0

## 2023-01-27 MED ORDER — OXYCODONE HCL 5 MG PO TABS
5.0000 mg | ORAL_TABLET | ORAL | 0 refills | Status: AC | PRN
  Filled 2023-01-27: qty 30, 5d supply, fill #0

## 2023-01-27 NOTE — Lactation Note (Signed)
This note was copied from a baby's chart. Lactation Consultation Note  Patient Name: Girl Inaara Holliman [id number] Date: 01/27/2023 Age:37 years Reason for consult: Follow-up assessment;Early term 37-38.6wks;Breastfeeding assistance;Infant weight loss (6.35% WL)  LC entered the room and the infant was in the bassinet.  Per the birth parent the last 3 feedings have been very good. She denies any pain or concerns.  [place] educated the parents on engorgement, mastitis, warning signs, infant I/O, and outpatient services. The birth parent had no questions or concerns.   Infant Feeding Plan: Breastfeed 8+ times in 24 hours according to feeding cues.  Watch infant output and call the pediatrician with questions or concerns.  Call the outpatient [facility] for assistance with breastfeeding.    Interventions Interventions: Education  Discharge Discharge Education: Engorgement and breast care;Warning signs for feeding baby;Outpatient recommendation  Consult Status Consult Status: Complete Date: 01/27/23 Follow-up type: Call as needed    Lysbeth Penner 01/27/2023, 11:57 AM

## 2023-01-30 ENCOUNTER — Encounter: Payer: Self-pay | Admitting: Family Medicine

## 2023-01-30 DIAGNOSIS — Z9851 Tubal ligation status: Secondary | ICD-10-CM | POA: Insufficient documentation

## 2023-01-30 LAB — SURGICAL PATHOLOGY

## 2023-01-31 ENCOUNTER — Encounter: Payer: Medicaid Other | Admitting: Obstetrics & Gynecology

## 2023-02-06 ENCOUNTER — Telehealth (HOSPITAL_COMMUNITY): Payer: Self-pay | Admitting: *Deleted

## 2023-02-06 NOTE — Telephone Encounter (Signed)
Mom reports feeling good. No concerns about herself at this time. EPDS not completed. Mom reports feeling well emotionally [facility] score=2) Mom reports baby is doing well. Feeding, peeing, and pooping without difficulty. Safe sleep reviewed. Mom reports no concerns about baby at present.  Odis Hollingshead, RN 02-06-2023 at 11:17am

## 2023-02-07 ENCOUNTER — Encounter: Payer: Medicaid Other | Admitting: Obstetrics and Gynecology

## 2023-02-09 ENCOUNTER — Inpatient Hospital Stay (HOSPITAL_COMMUNITY): Payer: Medicaid Other

## 2023-02-09 ENCOUNTER — Inpatient Hospital Stay (HOSPITAL_COMMUNITY)
Admission: RE | Admit: 2023-02-09 | Payer: Medicaid Other | Source: Home / Self Care | Admitting: Obstetrics & Gynecology

## 2023-02-27 ENCOUNTER — Telehealth (HOSPITAL_COMMUNITY): Payer: Self-pay

## 2023-02-27 NOTE — Telephone Encounter (Signed)
Chart review.

## 2023-03-08 ENCOUNTER — Encounter: Payer: Self-pay | Admitting: Family Medicine

## 2023-03-08 ENCOUNTER — Ambulatory Visit: Payer: Medicaid Other | Admitting: Family Medicine

## 2023-03-08 NOTE — Progress Notes (Signed)
Patient did not keep appointment today. She will be called to reschedule.  

## 2023-05-31 ENCOUNTER — Encounter: Payer: Self-pay | Admitting: Family Medicine

## 2023-09-27 ENCOUNTER — Ambulatory Visit
Admission: EM | Admit: 2023-09-27 | Discharge: 2023-09-27 | Disposition: A | Discharge status: Home / Self Care | Payer: Medicaid Other | Attending: Emergency Medicine | Admitting: Emergency Medicine

## 2023-09-27 DIAGNOSIS — J069 Acute upper respiratory infection, unspecified: Secondary | ICD-10-CM | POA: Diagnosis not present

## 2023-09-27 MED ORDER — AMOXICILLIN 500 MG PO CAPS: 500.0000 mg | ORAL_CAPSULE | Freq: Two times a day (BID) | ORAL | 0 refills | Status: AC

## 2023-09-27 MED ORDER — BENZONATATE 100 MG PO CAPS: 100.0000 mg | ORAL_CAPSULE | Freq: Three times a day (TID) | ORAL | 0 refills | Status: AC

## 2023-09-27 NOTE — ED Provider Notes (Signed)
Debbie Jackson    CSN: [id number] Arrival date & time: 09/27/23  1437      History   Chief Complaint Chief Complaint  Patient presents with   Cough    HPI Debbie Jackson is a 37 y.o. female.   Patient presents for evaluation of a primarily nonproductive cough and intermittent headaches present for 2 weeks.  Cough only productive first thing in the morning, improves throughout the day.  Has had difficulty catching breath that she feels like air is getting caught in the throat.  Denies shortness of breath or wheezing.  Experiencing intermittent sore throat but able to tolerate both food and liquids.  Has attempted use of over-the-counter cough medicine which has been ineffective.  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  Denies respiratory history, non-smoker.  Currently breast-feeding.  Denies ear pain, fever.  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   Group B streptococcal bacteriuria 05/05/2015    Past Surgical History:  Procedure Laterality Date   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   Procedure: Irrigation and debridement, complex repair of left long finger with primary closure, open reduction and percutaneous pinning, and nail bed repair, left long finger.;  Surgeon: Yolonda Kida, MD;  Location: [facility] OR;  Service:  Orthopedics;  Laterality: Left;   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   Procedure: Left long finger revision amputation;  Surgeon: Yolonda Kida, MD;  Location: [facility];  Service: Orthopedics;  Laterality: Left;   ECTOPIC PREGNANCY SURGERY  yrs ago   right tube removed after burst tubal pregnancy   KIDNEY SURGERY     multiple kidney surgeries, 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 Authorizing Provider  amoxicillin (AMOXIL) 500 MG capsule Take 1 capsule (500 mg total) by mouth 2 (two) times daily for 7 days. 09/27/23 10/04/23 Yes Johncarlo Maalouf R, NP  benzonatate (TESSALON) 100 MG capsule Take 1 capsule (100 mg total) by mouth every 8 (eight) hours. 09/27/23  Yes Dareon Nunziato, Elita Boone, NP  acetaminophen (TYLENOL) 325 MG tablet Take 2 tablets (650 mg total) by mouth every 4 (four) hours  as needed (for pain scale < 4). 01/27/23   Mercado-Ortiz, Lahoma Crocker, DO  benzocaine-Menthol (DERMOPLAST) 20-0.5 % AERO Apply 1 Application topically as needed for irritation (perineal discomfort). 01/27/23   Mercado-Ortiz, Lahoma Crocker, DO  ibuprofen (ADVIL) 600 MG tablet Take 1 tablet (600 mg total) by mouth every 6 (six) hours. 01/27/23   Mercado-Ortiz, Lahoma Crocker, DO  oxyCODONE (OXY IR/ROXICODONE) 5 MG immediate release tablet Take 1 tablet (5 mg total) by mouth every 4 (four) hours as needed (pain scale 4-7). 01/27/23   Mercado-Ortiz, Lahoma Crocker, DO  Prenatal Vit-Fe Fumarate-FA (PRENATAL VITAMINS PO) Take 1 tablet by mouth daily.     [provider]  senna-docusate (SENOKOT-S) 8.6-50 MG tablet Take 2 tablets by mouth daily. 01/27/23   Mercado-Ortiz, Lahoma Crocker, DO    Family History Family History  Problem Relation Age of Onset   Hypertension Mother    Hypertension Father    Cancer Maternal Grandfather    Cancer Paternal Grandfather    Alcohol abuse Neg Hx    Arthritis Neg Hx    Asthma Neg Hx    Birth defects Neg Hx    COPD Neg Hx    Depression Neg Hx    Diabetes Neg Hx    Drug abuse Neg Hx    Early death Neg Hx    Hearing loss Neg Hx    Heart disease Neg Hx    Hyperlipidemia Neg Hx    Kidney disease Neg Hx    Learning disabilities Neg Hx    Mental illness Neg Hx    Mental retardation Neg Hx    Miscarriages / Stillbirths Neg Hx    Stroke Neg Hx    Vision loss Neg Hx    Varicose Veins Neg Hx     Social History Social History   Tobacco Use   Smoking status: Never   Smokeless tobacco: Never  Vaping Use   Vaping status: Never Used  Substance Use Topics   Alcohol use: No   Drug use: No     Allergies   Patient has no known allergies.   Review of Systems Review of Systems   Physical Exam Triage Vital Signs ED Triage Vitals  Encounter Vitals Group     BP 09/27/23 1500 112/76     Systolic BP Percentile --      Diastolic BP Percentile --      Pulse Rate 09/27/23 1500 81     Resp 09/27/23 1500 18     Temp 09/27/23 1500 99.2 F (37.3 C)     Temp Source 09/27/23 1500 Oral     SpO2 09/27/23 1500 97 %     Weight --      Height --      Head Circumference --      Peak Flow --      Pain Score 09/27/23 1501 0     Pain Loc --      Pain Education --      Exclude from Growth Chart --    No data found.  Updated Vital Signs BP 112/76 (BP Location: Left Arm)   Pulse 81   Temp 99.2 F (37.3 C) (Oral)   Resp 18   LMP  (LMP Unknown)   SpO2 97%   Breastfeeding Yes   Visual Acuity Right Eye Distance:   Left Eye Distance:   Bilateral Distance:    Right Eye Near:   Left Eye  Near:    Bilateral Near:  Physical Exam Constitutional:      Appearance: Normal appearance.  HENT:     Head: Normocephalic.     Right Ear: Tympanic membrane, ear canal and external ear normal.     Left Ear: Tympanic membrane, ear canal and external ear normal.     Nose: Congestion present.     Mouth/Throat:     Mouth: Mucous membranes are moist.     Pharynx: Oropharynx is clear. Posterior oropharyngeal erythema present. No oropharyngeal exudate.     Comments: Erythema is mild Eyes:     Extraocular Movements: Extraocular movements intact.  Cardiovascular:     Rate and Rhythm: Normal rate and regular rhythm.     Pulses: Normal pulses.     Heart sounds: Normal heart sounds.  Pulmonary:     Effort: Pulmonary effort is normal.     Breath sounds: Normal breath sounds.  Musculoskeletal:        General: Normal range of motion.     Cervical back: Normal range of motion and neck supple.  Skin:    General: Skin is warm and dry.  Neurological:     Mental Status: She is alert and oriented to person, place, and time. Mental status is at baseline.      UC Treatments / Results  Labs (all labs ordered are listed, but only abnormal results are displayed) Labs Reviewed - No data to display  EKG   Radiology No results found.  Procedures Procedures (including critical care time)  Medications Ordered in UC Medications - No data to display  Initial Impression / Assessment and Plan / UC Course  I have reviewed the triage vital signs and the nursing notes.  Pertinent labs & imaging results that were available during my care of the patient were reviewed by me and considered in my medical decision making (see chart for details).  Acute URI  Patient is in no signs of distress nor toxic appearing.  Vital signs are stable.  Low suspicion for pneumonia, pneumothorax or bronchitis and therefore will defer imaging.  Present for 2 weeks will provide bacterial coverage, viral testing  deferred due to timeline.  Prescribed amoxicillin, additionally prescribed Tessalon for management of cough, currently breast-feeding, discussed with patient that minimal research available for use of Tessalon while breast-feeding, advised to use cautiously, may discuss with OB/GYN prior to use. May use additional over-the-counter medications as needed for supportive care.  May follow-up with urgent care as needed if symptoms persist or worsen.  Note given.   Final Clinical Impressions(s) / UC Diagnoses   Final diagnoses:  Acute URI     Discharge Instructions      Begin Amoxicillin every morning and every evening for 7 days to provide coverage for bacteria most likely causing symptoms to prolong  You may use Tessalon every 8 hours as needed to help calm coughing, little research has been given on this medicine to determine if she will cross into the breastmilk, please reach out to your OB/GYN to see if she/he has more information regarding this    You can take Tylenol and/or Ibuprofen as needed for fever reduction and pain relief.   For cough: honey 1/2 to 1 teaspoon (you can dilute the honey in water or another fluid).  You can also use guaifenesin and dextromethorphan for cough. You can use a humidifier for chest congestion and cough.  If you don't have a humidifier, you can sit in the bathroom with the hot shower running.  For sore throat: try warm salt water gargles, cepacol lozenges, throat spray, warm tea or water with lemon/honey, popsicles or ice, or OTC cold relief medicine for throat discomfort.   For congestion: take a daily anti-histamine like Zyrtec, Claritin, and a oral decongestant, such as pseudoephedrine.  You can also use Flonase 1-2 sprays in each nostril daily.   It is important to stay hydrated: drink plenty of fluids (water, gatorade/powerade/pedialyte, juices, or teas) to keep your throat moisturized and help further relieve irritation/discomfort.    ED  Prescriptions     Medication Sig Dispense Auth. Provider   amoxicillin (AMOXIL) 500 MG capsule Take 1 capsule (500 mg total) by mouth 2 (two) times daily for 7 days. 14 capsule Danasha Melman R, NP   benzonatate (TESSALON) 100 MG capsule Take 1 capsule (100 mg total) by mouth every 8 (eight) hours. 21 capsule Tsuyako Jolley, Elita Boone, NP      PDMP not reviewed this encounter.   Valinda Hoar, NP 09/27/23 [phone]

## 2023-09-27 NOTE — Discharge Instructions (Signed)
Begin Amoxicillin every morning and every evening for 7 days to provide coverage for bacteria most likely causing symptoms to prolong  You may use Tessalon every 8 hours as needed to help calm coughing, little research has been given on this medicine to determine if she will cross into the breastmilk, please reach out to your OB/GYN to see if she/he has more information regarding this    You can take Tylenol and/or Ibuprofen as needed for fever reduction and pain relief.   For cough: honey 1/2 to 1 teaspoon (you can dilute the honey in water or another fluid).  You can also use guaifenesin and dextromethorphan for cough. You can use a humidifier for chest congestion and cough.  If you don't have a humidifier, you can sit in the bathroom with the hot shower running.      For sore throat: try warm salt water gargles, cepacol lozenges, throat spray, warm tea or water with lemon/honey, popsicles or ice, or OTC cold relief medicine for throat discomfort.   For congestion: take a daily anti-histamine like Zyrtec, Claritin, and a oral decongestant, such as pseudoephedrine.  You can also use Flonase 1-2 sprays in each nostril daily.   It is important to stay hydrated: drink plenty of fluids (water, gatorade/powerade/pedialyte, juices, or teas) to keep your throat moisturized and help further relieve irritation/discomfort.

## 2023-09-27 NOTE — ED Triage Notes (Signed)
Cough, headache x 2 weeks. Tried OTC cough syrup with no relief of symptoms.

## 2024-09-13 ENCOUNTER — Ambulatory Visit
Admission: RE | Admit: 2024-09-13 | Discharge: 2024-09-13 | Disposition: A | Discharge status: Home / Self Care | Attending: Emergency Medicine | Admitting: Emergency Medicine

## 2024-09-13 VITALS — BP 117/70 | HR 96 | Temp 97.7°F | Resp 18

## 2024-09-13 DIAGNOSIS — J0100 Acute maxillary sinusitis, unspecified: Secondary | ICD-10-CM

## 2024-09-13 MED ORDER — AMOXICILLIN-POT CLAVULANATE 875-125 MG PO TABS: 1.0000 | ORAL_TABLET | Freq: Two times a day (BID) | ORAL | 0 refills | Status: AC

## 2024-09-13 NOTE — ED Triage Notes (Signed)
 Patient to Urgent Care with complaints of  cough/ headaches that hurts the back of her neck/ left sided ear pain.  Symptoms x3 weeks.   Taking nyquil (helps HA some).

## 2024-09-13 NOTE — Discharge Instructions (Addendum)
 Take the Augmentin as directed.  Follow up with your primary care provider if your symptoms are not improving.

## 2024-09-13 NOTE — ED Provider Notes (Signed)
 Debbie Jackson    CSN: [id number] Arrival date & time: 09/13/24  1547      History   Chief Complaint Chief Complaint  Patient presents with   Cough    Cough for 3 weeks headache back of neck pain sore throat - Entered by patient    Debbie Jackson is a 38 y.o. female.  Patient presents with 3-week history of congestion, ear pain, cough, headache.  No fever or shortness of breath.  Various OTC cold and sinus treatments attempted.  Patient is a lactating mother.  The history is provided by the patient and medical records.    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   Group B streptococcal bacteriuria 05/05/2015    Past Surgical History:  Procedure Laterality Date   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   Procedure: Irrigation and debridement, complex repair of left long finger with primary closure, open reduction and percutaneous pinning, and nail bed repair, left long finger.;  Surgeon: Sharl Selinda Dover, MD;  Location: [facility] OR;  Service: Orthopedics;  Laterality: Left;   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   Procedure: Left long finger revision amputation;  Surgeon: Sharl Selinda Dover, MD;  Location: [facility];  Service: Orthopedics;  Laterality: Left;   ECTOPIC PREGNANCY SURGERY  yrs ago   right tube removed after burst tubal  pregnancy   KIDNEY SURGERY     multiple kidney surgeries, born with double kidney   left finger necrosis     TUBAL LIGATION Bilateral 01/26/2023   Procedure: POST PARTUM TUBAL LIGATION;  Surgeon: Eldonna Suzen Octave, MD;  Location: [facility];  Service: Gynecology;  Laterality: Bilateral;    OB History     Gravida  7   Para  4   Term  4   Preterm      AB  3   Living  4      SAB  2   IAB  0   Ectopic  1   Multiple  0   Live Births  4            Home Medications    Prior to Admission medications   Medication Sig Start Date End Date Taking? Authorizing Provider  amoxicillin -clavulanate (AUGMENTIN ) 875-125 MG tablet Take 1 tablet by mouth every 12 (twelve) hours. 09/13/24  Yes Corlis Burnard DEL, NP  acetaminophen  (TYLENOL ) 325 MG tablet Take 2 tablets (650 mg total) by mouth every 4 (four) hours as needed (for pain scale < 4). 01/27/23   Mercado-Ortiz, Harlene RODES, DO  benzocaine -Menthol  (DERMOPLAST) 20-0.5 % AERO Apply 1 Application topically as needed for irritation (perineal discomfort). 01/27/23   Mercado-Ortiz, Harlene RODES, DO  benzonatate  (TESSALON ) 100 MG capsule Take 1 capsule (100 mg total) by mouth every 8 (eight) hours. Patient not taking: Reported on 09/13/2024 09/27/23   Teresa Shelba SAUNDERS, NP  ibuprofen  (  ADVIL ) 600 MG tablet Take 1 tablet (600 mg total) by mouth every 6 (six) hours. 01/27/23   Mercado-Ortiz, Harlene RODES, DO  oxyCODONE  (OXY IR/ROXICODONE ) 5 MG immediate release tablet Take 1 tablet (5 mg total) by mouth every 4 (four) hours as needed (pain scale 4-7). 01/27/23   Mercado-Ortiz, Harlene RODES, DO  Prenatal Vit-Fe Fumarate-FA (PRENATAL VITAMINS PO) Take 1 tablet by mouth daily.    [provider]  senna-docusate (SENOKOT-S) 8.6-50 MG tablet Take 2 tablets by mouth daily. 01/27/23   Mercado-Ortiz, Harlene RODES, DO    Family History Family History  Problem Relation Age of Onset   Hypertension Mother    Hypertension Father    Cancer Maternal Grandfather     Cancer Paternal Grandfather    Alcohol abuse Neg Hx    Arthritis Neg Hx    Asthma Neg Hx    Birth defects Neg Hx    COPD Neg Hx    Depression Neg Hx    Diabetes Neg Hx    Drug abuse Neg Hx    Early death Neg Hx    Hearing loss Neg Hx    Heart disease Neg Hx    Hyperlipidemia Neg Hx    Kidney disease Neg Hx    Learning disabilities Neg Hx    Mental illness Neg Hx    Mental retardation Neg Hx    Miscarriages / Stillbirths Neg Hx    Stroke Neg Hx    Vision loss Neg Hx    Varicose Veins Neg Hx     Social History Social History   Tobacco Use   Smoking status: Never   Smokeless tobacco: Never  Vaping Use   Vaping status: Never Used  Substance Use Topics   Alcohol use: No   Drug use: No     Allergies   Patient has no known allergies.   Review of Systems Review of Systems  Constitutional:  Negative for chills and fever.  HENT:  Positive for congestion, ear pain, postnasal drip, rhinorrhea and sinus pressure. Negative for sore throat.   Respiratory:  Positive for cough. Negative for shortness of breath.   Neurological:  Positive for headaches.     Physical Exam Triage Vital Signs ED Triage Vitals  Encounter Vitals Group     BP 09/13/24 1619 117/70     Girls Systolic BP Percentile --      Girls Diastolic BP Percentile --      Boys Systolic BP Percentile --      Boys Diastolic BP Percentile --      Pulse Rate 09/13/24 1619 96     Resp 09/13/24 1619 18     Temp 09/13/24 1619 97.7 F (36.5 C)     Temp src --      SpO2 09/13/24 1619 97 %     Weight --      Height --      Head Circumference --      Peak Flow --      Pain Score 09/13/24 1625 4     Pain Loc --      Pain Education --      Exclude from Growth Chart --    No data found.  Updated Vital Signs BP 117/70   Pulse 96   Temp 97.7 F (36.5 C)   Resp 18   SpO2 97%   Breastfeeding Yes   Visual Acuity Right Eye Distance:   Left Eye Distance:   Bilateral Distance:  Right Eye Near:    Left Eye Near:    Bilateral Near:     Physical Exam Constitutional:      General: She is not in acute distress. HENT:     Right Ear: Tympanic membrane normal.     Left Ear: Tympanic membrane normal.     Nose: Congestion present.     Mouth/Throat:     Mouth: Mucous membranes are moist.     Pharynx: Oropharynx is clear.  Cardiovascular:     Rate and Rhythm: Normal rate and regular rhythm.     Heart sounds: Normal heart sounds.  Pulmonary:     Effort: Pulmonary effort is normal. No respiratory distress.     Breath sounds: Normal breath sounds.  Neurological:     Mental Status: She is alert.      UC Treatments / Results  Labs (all labs ordered are listed, but only abnormal results are displayed) Labs Reviewed - No data to display  EKG   Radiology No results found.  Procedures Procedures (including critical care time)  Medications Ordered in UC Medications - No data to display  Initial Impression / Assessment and Plan / UC Course  I have reviewed the triage vital signs and the nursing notes.  Pertinent labs & imaging results that were available during my care of the patient were reviewed by me and considered in my medical decision making (see chart for details).    Acute sinusitis, lactating mother.  Afebrile and vital signs are stable.  Patient has been symptomatic for 3 weeks.  Treating today with Augmentin .  Instructed her to follow-up with her PCP if she is not improving.  Education provided on sinus infection.  She agrees to plan of care.  Final Clinical Impressions(s) / UC Diagnoses   Final diagnoses:  Acute non-recurrent maxillary sinusitis  Lactating mother     Discharge Instructions      Take the Augmentin  as directed.  Follow-up with your primary care provider if your symptoms are not improving.      ED Prescriptions     Medication Sig Dispense Auth. Provider   amoxicillin -clavulanate (AUGMENTIN ) 875-125 MG tablet Take 1 tablet by mouth  every 12 (twelve) hours. 14 tablet Corlis Burnard DEL, NP      PDMP not reviewed this encounter.   Corlis Burnard DEL, NP 09/13/24 [phone]
